# Patient Record
Sex: Female | Born: 1935 | Race: White | Hispanic: No | Marital: Single
Health system: Southern US, Community
[De-identification: ages and names within clinical notes are randomized; demographics above are authoritative.]

## PROBLEM LIST (undated history)

## (undated) DIAGNOSIS — Z9109 Other allergy status, other than to drugs and biological substances: Secondary | ICD-10-CM

## (undated) DIAGNOSIS — E785 Hyperlipidemia, unspecified: Secondary | ICD-10-CM

## (undated) DIAGNOSIS — G479 Sleep disorder, unspecified: Secondary | ICD-10-CM

## (undated) DIAGNOSIS — G44009 Cluster headache syndrome, unspecified, not intractable: Secondary | ICD-10-CM

## (undated) DIAGNOSIS — C801 Malignant (primary) neoplasm, unspecified: Secondary | ICD-10-CM

## (undated) DIAGNOSIS — K635 Polyp of colon: Secondary | ICD-10-CM

## (undated) DIAGNOSIS — M1990 Unspecified osteoarthritis, unspecified site: Secondary | ICD-10-CM

## (undated) DIAGNOSIS — M8580 Other specified disorders of bone density and structure, unspecified site: Secondary | ICD-10-CM

## (undated) HISTORY — PX: BILATERAL ANTERIOR TOTAL HIP ARTHROPLASTY: SHX5567

## (undated) HISTORY — DX: Other specified disorders of bone density and structure, unspecified site: M85.80

## (undated) HISTORY — DX: Hyperlipidemia, unspecified: E78.5

## (undated) HISTORY — PX: TUBAL LIGATION: SHX77

## (undated) HISTORY — DX: Polyp of colon: K63.5

## (undated) HISTORY — PX: REPLACEMENT TOTAL KNEE: SUR1224

## (undated) HISTORY — DX: Malignant (primary) neoplasm, unspecified: C80.1

## (undated) HISTORY — DX: Cluster headache syndrome, unspecified, not intractable: G44.009

---

## 1998-06-26 ENCOUNTER — Other Ambulatory Visit: Admission: RE | Admit: 1998-06-26 | Discharge: 1998-06-26 | Payer: Self-pay | Admitting: Obstetrics and Gynecology

## 1999-08-13 ENCOUNTER — Other Ambulatory Visit: Admission: RE | Admit: 1999-08-13 | Discharge: 1999-08-13 | Payer: Self-pay | Admitting: Obstetrics and Gynecology

## 2000-08-13 ENCOUNTER — Other Ambulatory Visit: Admission: RE | Admit: 2000-08-13 | Discharge: 2000-08-13 | Payer: Self-pay | Admitting: Obstetrics and Gynecology

## 2001-10-13 DIAGNOSIS — C439 Malignant melanoma of skin, unspecified: Secondary | ICD-10-CM

## 2001-10-13 DIAGNOSIS — C801 Malignant (primary) neoplasm, unspecified: Secondary | ICD-10-CM

## 2001-10-13 HISTORY — DX: Malignant melanoma of skin, unspecified: C43.9

## 2001-10-13 HISTORY — PX: MELANOMA EXCISION: SHX5266

## 2001-10-13 HISTORY — DX: Malignant (primary) neoplasm, unspecified: C80.1

## 2002-01-19 ENCOUNTER — Other Ambulatory Visit: Admission: RE | Admit: 2002-01-19 | Discharge: 2002-01-19 | Payer: Self-pay | Admitting: Obstetrics and Gynecology

## 2004-02-14 ENCOUNTER — Other Ambulatory Visit: Admission: RE | Admit: 2004-02-14 | Discharge: 2004-02-14 | Payer: Self-pay | Admitting: Obstetrics and Gynecology

## 2005-04-17 ENCOUNTER — Other Ambulatory Visit: Admission: RE | Admit: 2005-04-17 | Discharge: 2005-04-17 | Payer: Self-pay | Admitting: Addiction Medicine

## 2006-04-24 ENCOUNTER — Other Ambulatory Visit: Admission: RE | Admit: 2006-04-24 | Discharge: 2006-04-24 | Payer: Self-pay | Admitting: Obstetrics and Gynecology

## 2007-04-27 ENCOUNTER — Other Ambulatory Visit: Admission: RE | Admit: 2007-04-27 | Discharge: 2007-04-27 | Payer: Self-pay | Admitting: Obstetrics and Gynecology

## 2008-05-04 ENCOUNTER — Other Ambulatory Visit: Admission: RE | Admit: 2008-05-04 | Discharge: 2008-05-04 | Payer: Self-pay | Admitting: Obstetrics and Gynecology

## 2009-05-11 ENCOUNTER — Encounter: Payer: Self-pay | Admitting: Obstetrics and Gynecology

## 2009-05-11 ENCOUNTER — Ambulatory Visit: Payer: Self-pay | Admitting: Obstetrics and Gynecology

## 2009-05-11 ENCOUNTER — Other Ambulatory Visit: Admission: RE | Admit: 2009-05-11 | Discharge: 2009-05-11 | Payer: Self-pay | Admitting: Obstetrics and Gynecology

## 2009-06-07 ENCOUNTER — Ambulatory Visit: Payer: Self-pay | Admitting: Internal Medicine

## 2009-06-19 ENCOUNTER — Ambulatory Visit: Payer: Self-pay | Admitting: Internal Medicine

## 2009-10-13 HISTORY — PX: KNEE SURGERY: SHX244

## 2010-02-21 ENCOUNTER — Ambulatory Visit: Payer: Self-pay | Admitting: Family Medicine

## 2010-02-21 DIAGNOSIS — Z860100 Personal history of colon polyps, unspecified: Secondary | ICD-10-CM | POA: Insufficient documentation

## 2010-02-21 DIAGNOSIS — G44009 Cluster headache syndrome, unspecified, not intractable: Secondary | ICD-10-CM

## 2010-02-21 DIAGNOSIS — J019 Acute sinusitis, unspecified: Secondary | ICD-10-CM

## 2010-02-21 DIAGNOSIS — M1710 Unilateral primary osteoarthritis, unspecified knee: Secondary | ICD-10-CM

## 2010-02-21 LAB — CONVERTED CEMR LAB
Blood in Urine, dipstick: NEGATIVE
Hemoglobin: 14.2 g/dL
Ketones, urine, test strip: NEGATIVE
Nitrite: NEGATIVE
Protein, U semiquant: NEGATIVE
Specific Gravity, Urine: 1.02
Urobilinogen, UA: 0.2
WBC Urine, dipstick: NEGATIVE

## 2010-02-25 ENCOUNTER — Encounter: Payer: Self-pay | Admitting: Family Medicine

## 2010-04-16 ENCOUNTER — Inpatient Hospital Stay (HOSPITAL_COMMUNITY): Admission: RE | Admit: 2010-04-16 | Discharge: 2010-04-18 | Payer: Self-pay | Admitting: Orthopedic Surgery

## 2010-05-13 ENCOUNTER — Ambulatory Visit: Payer: Self-pay | Admitting: Obstetrics and Gynecology

## 2010-11-12 NOTE — Assessment & Plan Note (Signed)
Summary: new pt/pre surgical exam/ok per doc/njr   Vital Signs:  Patient profile:   75 year old female Weight:      98 pounds O2 Sat:      94 % Temp:     98.2 degrees F Pulse rate:   70 / minute BP sitting:   140 / 90  (left arm)  Vitals Entered By: Pura Spice, RN (Feb 21, 2010 10:48 AM) CC: to establish having rt knee replacement soon by dr Charlann Boxer needs clearance.  Is Patient Diabetic? No   History of Present Illness: This 76 year old white-haired female is in to establish as a new patient and also to have a preoperative examination for a right knee replacement by Dr. Susy Frizzle:  Charlann Boxer She has had arthritis of the knees and it has progressed to the point that her right knee is severe enough that the lower leg is in a valgus position to 20 She has fallen twice in the last month and have been severe enough pain and inability to function that she is ready for a right knee replacement Blood pressure rechecked 130/78 Hasn't centimeter related with 2 mg one at h.s. Patient is on Actonel and calcium plus vitamin D Dr. Herma Mering history gynecologist and does her Pap smear and ordered her mammogram and bone density studies  EKG  Procedure date:  02/21/2010  Findings:       sinus rhythm with rate of:  60 sinus bradycardia  normal ECG except for rate   Allergies (verified): No Known Drug Allergies  Past History:  Past Medical History: history of migraine headache Osteopenia Chronic insomnia Arthritis  Past Surgical History: colonoscopic exam with polyps benign  Review of Systems      See HPI General:  See HPI; Denies chills, fatigue, fever, loss of appetite, malaise, sleep disorder, sweats, weakness, and weight loss. Eyes:  Denies blurring, discharge, double vision, eye irritation, eye pain, halos, itching, light sensitivity, red eye, vision loss-1 eye, and vision loss-both eyes. ENT:  Denies decreased hearing, difficulty swallowing, ear discharge, earache, hoarseness, nasal  congestion, nosebleeds, postnasal drainage, ringing in ears, sinus pressure, and sore throat. CV:  Denies bluish discoloration of lips or nails, chest pain or discomfort, difficulty breathing at night, difficulty breathing while lying down, fainting, fatigue, leg cramps with exertion, lightheadness, near fainting, palpitations, shortness of breath with exertion, swelling of feet, swelling of hands, and weight gain. Resp:  Denies chest discomfort, chest pain with inspiration, cough, coughing up blood, excessive snoring, hypersomnolence, morning headaches, pleuritic, shortness of breath, sputum productive, and wheezing. GI:  Denies abdominal pain, bloody stools, change in bowel habits, constipation, dark tarry stools, diarrhea, excessive appetite, gas, hemorrhoids, indigestion, loss of appetite, nausea, vomiting, vomiting blood, and yellowish skin color. GU:  Denies abnormal vaginal bleeding, decreased libido, discharge, dysuria, genital sores, hematuria, incontinence, nocturia, urinary frequency, and urinary hesitancy. MS:  See HPI; Complains of joint pain. Derm:  Denies changes in color of skin, changes in nail beds, dryness, excessive perspiration, flushing, hair loss, insect bite(s), itching, lesion(s), poor wound healing, and rash. Neuro:  Complains of headaches; migraines, takes Depakote ER for prevention. Psych:  Denies alternate hallucination ( auditory/visual), anxiety, depression, easily angered, easily tearful, irritability, mental problems, panic attacks, sense of great danger, suicidal thoughts/plans, thoughts of violence, unusual visions or sounds, and thoughts /plans of harming others.  Physical Exam  General:  Well-developed,well-nourished,in no acute distress; alert,appropriate and cooperative throughout examination Head:  Normocephalic and atraumatic without obvious abnormalities. No apparent alopecia  or balding. Eyes:  No corneal or conjunctival inflammation noted. EOMI. Perrla.  Funduscopic exam benign, without hemorrhages, exudates or papilledema. Vision grossly normal. Ears:  External ear exam shows no significant lesions or deformities.  Otoscopic examination reveals clear canals, tympanic membranes are intact bilaterally without bulging, retraction, inflammation or discharge. Hearing is grossly normal bilaterally. Nose:  External nasal examination shows no deformity or inflammation. Nasal mucosa are pink and moist without lesions or exudates. Mouth:  Oral mucosa and oropharynx without lesions or exudates.  Teeth in good repair. Neck:  No deformities, masses, or tenderness noted. Chest Wall:  No deformities, masses, or tenderness noted. Breasts:  No mass, nodules, thickening, tenderness, bulging, retraction, inflamation, nipple discharge or skin changes noted.   Lungs:  Normal respiratory effort, chest expands symmetrically. Lungs are clear to auscultation, no crackles or wheezes. Heart:  Normal rate and regular rhythm. S1 and S2 normal without gallop, murmur, click, rub or other extra sounds. Abdomen:  Bowel sounds positive,abdomen soft and non-tender without masses, organomegaly or hernias noted. Rectal:  not examined Genitalia:  not examined Msk:  right knee is tender painful movement, lower leg from knee is in a valgus position 20 outward Pulses:  R and L carotid,radial,femoral,dorsalis pedis and posterior tibial pulses are full and equal bilaterally Extremities:  No clubbing, cyanosis, edema, or deformity noted with normal full range of motion of all joints.   Neurologic:  No cranial nerve deficits noted. Station and gait are normal. Plantar reflexes are down-going bilaterally. DTRs are symmetrical throughout. Sensory, motor and coordinative functions appear intact.   Impression & Recommendations:  Problem # 1:  OSTEOARTHRITIS, KNEES, BILATERAL (ICD-715.96) Assessment New  Problem # 2:  COLONIC POLYPS, HX OF (ICD-V12.72) Assessment: Unchanged  Problem # 3:   HEADACHE (ICD-784.0) Assessment: Unchanged  Complete Medication List: 1)  Actonel  2)  Calcium Carbonate 600 Mg Tabs (Calcium carbonate) .... Two times a day 3)  Depakote Er  4)  Lunesta 3 Mg Tabs (Eszopiclone) .Marland Kitchen.. 1 hs  Other Orders: EKG w/ Interpretation (93000) Hgb (46270) UA Dipstick w/o Micro (automated)  (81003) Fingerstick (35009)  Patient Instructions: 1)  physical examination revealed a healthy female that is in good physical condition to have a knee replacement of the right knee. Hemoglobin 14.2 2)  Urinalysis negative 3)  Since form to Dr. Charlann Boxer Prescriptions: LUNESTA 3 MG TABS (ESZOPICLONE) 1 hs  #30 x 0   Entered and Authorized by:   Judithann Sheen MD   Signed by:   Judithann Sheen MD on 02/21/2010   Method used:   Print then Give to Patient   RxID:   [id number]   Laboratory Results   Urine Tests  Date/Time Recieved: Feb 21, 2010 12:06 PM  Date/Time Reported: Feb 21, 2010 12:06 PM   Routine Urinalysis   Color: yellow Appearance: Clear Glucose: negative   (Normal Range: Negative) Bilirubin: negative   (Normal Range: Negative) Ketone: negative   (Normal Range: Negative) Spec. Gravity: 1.020   (Normal Range: 1.003-1.035) Blood: negative   (Normal Range: Negative) pH: 5.0   (Normal Range: 5.0-8.0) Protein: negative   (Normal Range: Negative) Urobilinogen: 0.2   (Normal Range: 0-1) Nitrite: negative   (Normal Range: Negative) Leukocyte Esterace: negative   (Normal Range: Negative)    Comments: Wynona Canes, CMA  Feb 21, 2010 12:07 PM   Blood Tests   Date/Time Recieved: Feb 21, 2010 12:05 PM  Date/Time Reported: Feb 21, 2010 12:05 PM  CBC HGB:  14.2 g/dL   (Normal Range: 28.4-13.2 in Males, 12.0-15.0 in Females) Comments: Wynona Canes, CMA  Feb 21, 2010 12:06 PM      Laboratory Results   Urine Tests    Routine Urinalysis   Color: yellow Appearance: Clear Glucose: negative   (Normal Range:  Negative) Bilirubin: negative   (Normal Range: Negative) Ketone: negative   (Normal Range: Negative) Spec. Gravity: 1.020   (Normal Range: 1.003-1.035) Blood: negative   (Normal Range: Negative) pH: 5.0   (Normal Range: 5.0-8.0) Protein: negative   (Normal Range: Negative) Urobilinogen: 0.2   (Normal Range: 0-1) Nitrite: negative   (Normal Range: Negative) Leukocyte Esterace: negative   (Normal Range: Negative)    Comments: Wynona Canes, CMA  Feb 21, 2010 12:07 PM   CBC   HGB:  14.2 g/dL   (Normal Range: 44.0-10.2 in Males, 12.0-15.0 in Females) Comments: Wynona Canes, CMA  Feb 21, 2010 12:06 PM       Appended Document: new pt/pre surgical exam/ok per doc/njr electrocardiogram normal sinus bradycardia

## 2010-11-15 NOTE — Letter (Signed)
Summary: [facility]  [facility]   Imported By: Maryln Gottron 03/14/2010 14:58:50  _____________________________________________________________________  External Attachment:    Type:   Image     Comment:   External Document

## 2010-12-29 LAB — ABO/RH: ABO/RH(D): O POS

## 2010-12-29 LAB — CBC
HCT: 29.8 % — ABNORMAL LOW (ref 36.0–46.0)
HCT: 40.4 % (ref 36.0–46.0)
Hemoglobin: 9.9 g/dL — ABNORMAL LOW (ref 12.0–15.0)
MCHC: 34.9 g/dL (ref 30.0–36.0)
MCV: 92.9 fL (ref 78.0–100.0)
MCV: 94.3 fL (ref 78.0–100.0)
RBC: 3.2 MIL/uL — ABNORMAL LOW (ref 3.87–5.11)
RBC: 4.29 MIL/uL (ref 3.87–5.11)
RDW: 13.4 % (ref 11.5–15.5)
RDW: 13.7 % (ref 11.5–15.5)
WBC: 6.2 10*3/uL (ref 4.0–10.5)
WBC: 7.1 10*3/uL (ref 4.0–10.5)
WBC: 4.4 10*3/uL (ref 4.0–10.5)

## 2010-12-29 LAB — URINALYSIS, ROUTINE W REFLEX MICROSCOPIC
Bilirubin Urine: NEGATIVE
Glucose, UA: NEGATIVE mg/dL
Hgb urine dipstick: NEGATIVE
Specific Gravity, Urine: 1.024 (ref 1.005–1.030)
pH: 6 (ref 5.0–8.0)

## 2010-12-29 LAB — PROTIME-INR: INR: 0.98 (ref 0.00–1.49)

## 2010-12-29 LAB — SURGICAL PCR SCREEN
MRSA, PCR: NEGATIVE
Staphylococcus aureus: POSITIVE — AB

## 2010-12-29 LAB — BASIC METABOLIC PANEL
BUN: 14 mg/dL (ref 6–23)
BUN: 18 mg/dL (ref 6–23)
Calcium: 8.2 mg/dL — ABNORMAL LOW (ref 8.4–10.5)
Chloride: 106 mEq/L (ref 96–112)
Chloride: 102 mEq/L (ref 96–112)
GFR calc Af Amer: >60 mL/min (ref >60)
GFR calc non Af Amer: >60 mL/min (ref >60)
Glucose, Bld: 87 mg/dL (ref 70–99)
Potassium: 3.9 mEq/L (ref 3.5–5.1)
Potassium: 4 mEq/L (ref 3.5–5.1)
Potassium: 4.6 mEq/L (ref 3.5–5.1)
Sodium: 140 mEq/L (ref 135–145)
Sodium: 141 mEq/L (ref 135–145)

## 2010-12-29 LAB — DIFFERENTIAL
Eosinophils Absolute: 0.2 10*3/uL (ref 0.0–0.7)
Eosinophils Relative: 5 % (ref 0–5)
Lymphs Abs: 0.9 10*3/uL (ref 0.7–4.0)
Monocytes Relative: 8 % (ref 3–12)
Neutrophils Relative %: 66 % (ref 43–77)

## 2010-12-29 LAB — URINE MICROSCOPIC-ADD ON

## 2010-12-29 LAB — TYPE AND SCREEN: Antibody Screen: NEGATIVE

## 2011-05-13 ENCOUNTER — Encounter: Payer: Self-pay | Admitting: Obstetrics and Gynecology

## 2011-05-20 ENCOUNTER — Encounter: Payer: Self-pay | Admitting: Gynecology

## 2011-05-20 ENCOUNTER — Encounter: Payer: Self-pay | Admitting: Obstetrics and Gynecology

## 2011-06-04 ENCOUNTER — Other Ambulatory Visit (HOSPITAL_COMMUNITY)
Admission: RE | Admit: 2011-06-04 | Discharge: 2011-06-04 | Disposition: A | Discharge status: Home / Self Care | Payer: Medicare Other | Source: Ambulatory Visit | Attending: Obstetrics and Gynecology | Admitting: Obstetrics and Gynecology

## 2011-06-04 ENCOUNTER — Encounter: Payer: Self-pay | Admitting: Obstetrics and Gynecology

## 2011-06-04 ENCOUNTER — Ambulatory Visit (INDEPENDENT_AMBULATORY_CARE_PROVIDER_SITE_OTHER): Payer: Medicare Other | Admitting: Obstetrics and Gynecology

## 2011-06-04 VITALS — BP 112/60 | Ht 59.0 in | Wt 98.0 lb

## 2011-06-04 DIAGNOSIS — Z124 Encounter for screening for malignant neoplasm of cervix: Secondary | ICD-10-CM | POA: Insufficient documentation

## 2011-06-04 DIAGNOSIS — M8580 Other specified disorders of bone density and structure, unspecified site: Secondary | ICD-10-CM | POA: Insufficient documentation

## 2011-06-04 DIAGNOSIS — K649 Unspecified hemorrhoids: Secondary | ICD-10-CM | POA: Insufficient documentation

## 2011-06-04 NOTE — Progress Notes (Signed)
The patient came back to see me today for further follow up. The first thing we discussed with her was her Actonel. She is now been on it for 6 years. Her last bone density showed improvement of 3 of the 4 areas of interest. She takes calcium, D, and Exercises regularly. She has had no fractures. She does have some atrophic vaginitis but says she has no discomfort with intercourse. She is doing well with her hemorrhoids now without any bleeding. She does have nocturia but not frequently. She is having no pelvic pain and no vaginal bleeding.  Past medical history, family history, and social history all reviewed. They are in epic record.  Review of systems reviewed( 9 point) and other than the above there are no pertinent positives or negative.  Physical examination: HEENT within normal limits. Neck: Thyroid not large. No masses. Supraclavicular nodes: not enlarged. Breasts: Examined in both sitting midline position. No skin changes and no masses. Abdomen: Soft no guarding rebound or masses or hernia. Pelvic: External: Within normal limits. BUS: Within normal limits. Vaginal:within normal limits. Poor estrogen effect. No evidence of cystocele rectocele or enterocele. Cervix: clean. Uterus: Normal size and shape. Adnexa: No masses. Rectovaginal exam: Confirmatory and negative. Extremities: Within normal limits.  Assessment 1. Low bone mass 2. Atrophic vaginitis 3. Hemorrhoids 4. Nocturia  Plan: We have placed her on drug holiday from her Actonel. When she gets her mammogram next her she will get a followup bone density. We will not treat with medication any of  the above problems.

## 2011-06-04 NOTE — Patient Instructions (Signed)
Check with Dr. Scotty Court UJ:[id number] test. Mammogram and bone density next summer.

## 2011-06-26 ENCOUNTER — Encounter: Payer: PRIVATE HEALTH INSURANCE | Admitting: Family Medicine

## 2011-07-17 ENCOUNTER — Encounter: Payer: PRIVATE HEALTH INSURANCE | Admitting: Family Medicine

## 2011-11-10 ENCOUNTER — Other Ambulatory Visit: Payer: Self-pay | Admitting: Family Medicine

## 2011-11-14 NOTE — Telephone Encounter (Signed)
Rx last sent 02/16/10. Pls advise.

## 2011-11-16 NOTE — Telephone Encounter (Signed)
DENIED.  This patient has not been seen here in almost 2 years!

## 2012-05-14 ENCOUNTER — Other Ambulatory Visit: Payer: Self-pay | Admitting: *Deleted

## 2012-05-14 DIAGNOSIS — M8580 Other specified disorders of bone density and structure, unspecified site: Secondary | ICD-10-CM

## 2012-06-04 ENCOUNTER — Encounter: Payer: Self-pay | Admitting: Obstetrics and Gynecology

## 2012-06-04 ENCOUNTER — Ambulatory Visit (INDEPENDENT_AMBULATORY_CARE_PROVIDER_SITE_OTHER): Payer: Medicare Other | Admitting: Obstetrics and Gynecology

## 2012-06-04 VITALS — BP 124/70 | Ht 59.0 in | Wt 96.0 lb

## 2012-06-04 DIAGNOSIS — M949 Disorder of cartilage, unspecified: Secondary | ICD-10-CM

## 2012-06-04 DIAGNOSIS — N952 Postmenopausal atrophic vaginitis: Secondary | ICD-10-CM

## 2012-06-04 DIAGNOSIS — M8580 Other specified disorders of bone density and structure, unspecified site: Secondary | ICD-10-CM

## 2012-06-04 DIAGNOSIS — M899 Disorder of bone, unspecified: Secondary | ICD-10-CM

## 2012-06-04 DIAGNOSIS — R351 Nocturia: Secondary | ICD-10-CM

## 2012-06-04 DIAGNOSIS — K649 Unspecified hemorrhoids: Secondary | ICD-10-CM

## 2012-06-04 NOTE — Progress Notes (Signed)
Patient came to see me today for further followup. We have watched her for a long time with osteopenia. She took Actonel  for 6 years. She used to do her bone densities at [facility] radiology. Due to stability we put her on drug holiday one year ago. She has had no fractures. Her last bone density was 2011. She is scheduled for yearly mammogram for next week. She has always had normal Pap smears. Her last Pap smear was 2012. She is having no vaginal bleeding. She is having no pelvic pain. She has atrophic vaginitis but is comfortable with intercourse. She does have hemorrhoids but they have been asymptomatic recently. She is looking for a new PCP as her PCP retired. She does have nocturia x3 but is not a good sleeper and does not want take medication for this. She does not have urgency or urinary incontinence. She does not have dysuria.  ROS: 12 system review done. Pertinent positives above.  Physical examination:Veronica Wells present. HEENT within normal limits. Neck: Thyroid not large. No masses. Supraclavicular nodes: not enlarged. Breasts: Examined in both sitting and lying  position. No skin changes and no masses. Abdomen: Soft no guarding rebound or masses or hernia. Pelvic: External: Within normal limits. BUS: Within normal limits. Vaginal:within normal limits. Poor estrogen effect. No evidence of cystocele rectocele or enterocele. Cervix: clean. Uterus: Normal size and shape. Adnexa: No masses. Rectovaginal exam: Confirmatory and negative. Extremities: Within normal limits.  Assessment: #1. Osteopenia #2. Atrophic vaginitis # 3. Nocturia #4. Hemorrhoids  Plan: Mammogram and  bone density next week. The new Pap smear guidelines were discussed with the patient. No pap done.

## 2012-06-04 NOTE — Patient Instructions (Signed)
Schedule  bone density at time of mammogram.

## 2012-06-18 ENCOUNTER — Encounter: Payer: Self-pay | Admitting: Obstetrics and Gynecology

## 2012-06-22 ENCOUNTER — Other Ambulatory Visit: Payer: Self-pay | Admitting: Obstetrics and Gynecology

## 2012-06-22 DIAGNOSIS — M8580 Other specified disorders of bone density and structure, unspecified site: Secondary | ICD-10-CM

## 2012-07-05 ENCOUNTER — Telehealth: Payer: Self-pay | Admitting: *Deleted

## 2012-07-05 NOTE — Telephone Encounter (Signed)
Okay. Give her 68 with one refill.

## 2012-07-05 NOTE — Telephone Encounter (Signed)
(  pt aware you out of the office) Pt calling requesting rx for lunesta 2 mg, pt said you have given this to before. Please advise

## 2012-07-06 MED ORDER — ESZOPICLONE 2 MG PO TABS: 2.0000 mg | ORAL_TABLET | Freq: Every day | ORAL | Status: DC

## 2012-07-06 NOTE — Telephone Encounter (Signed)
rx sent, pt informed as well.  

## 2012-07-06 NOTE — Addendum Note (Signed)
Addended by: Aura Camps on: 07/06/2012 09:01 AM   Modules accepted: Orders

## 2012-07-20 NOTE — Telephone Encounter (Signed)
Rx called into pharmacy, pt said pharmacy never received Rx.

## 2012-09-24 ENCOUNTER — Other Ambulatory Visit: Payer: Self-pay | Admitting: Obstetrics and Gynecology

## 2012-09-24 NOTE — Telephone Encounter (Signed)
Called in [facility] 

## 2013-04-13 ENCOUNTER — Other Ambulatory Visit: Payer: Self-pay | Admitting: Dermatology

## 2013-04-16 ENCOUNTER — Other Ambulatory Visit: Payer: Self-pay | Admitting: Obstetrics and Gynecology

## 2013-04-19 NOTE — Telephone Encounter (Signed)
rx called in [facility] 

## 2013-06-07 ENCOUNTER — Encounter: Payer: Self-pay | Admitting: Gynecology

## 2013-06-07 ENCOUNTER — Ambulatory Visit (INDEPENDENT_AMBULATORY_CARE_PROVIDER_SITE_OTHER): Payer: Medicare Other | Admitting: Gynecology

## 2013-06-07 VITALS — BP 126/78 | Ht <= 58 in | Wt 99.0 lb

## 2013-06-07 DIAGNOSIS — M8580 Other specified disorders of bone density and structure, unspecified site: Secondary | ICD-10-CM

## 2013-06-07 DIAGNOSIS — N952 Postmenopausal atrophic vaginitis: Secondary | ICD-10-CM

## 2013-06-07 DIAGNOSIS — R229 Localized swelling, mass and lump, unspecified: Secondary | ICD-10-CM

## 2013-06-07 DIAGNOSIS — R2232 Localized swelling, mass and lump, left upper limb: Secondary | ICD-10-CM

## 2013-06-07 DIAGNOSIS — K649 Unspecified hemorrhoids: Secondary | ICD-10-CM

## 2013-06-07 DIAGNOSIS — Z23 Encounter for immunization: Secondary | ICD-10-CM

## 2013-06-07 DIAGNOSIS — R2230 Localized swelling, mass and lump, unspecified upper limb: Secondary | ICD-10-CM | POA: Insufficient documentation

## 2013-06-07 DIAGNOSIS — M899 Disorder of bone, unspecified: Secondary | ICD-10-CM

## 2013-06-07 NOTE — Patient Instructions (Addendum)
Tetanus, Diphtheria, Pertussis (Tdap) Vaccine What You Need to Know WHY GET VACCINATED? Tetanus, diphtheria and pertussis can be very serious diseases, even for adolescents and adults. Tdap vaccine can protect us from these diseases. TETANUS (Lockjaw) causes painful muscle tightening and stiffness, usually all over the body.  It can lead to tightening of muscles in the head and neck so you can't open your mouth, swallow, or sometimes even breathe. Tetanus kills about 1 out of 5 people who are infected. DIPHTHERIA can cause a thick coating to form in the back of the throat.  It can lead to breathing problems, paralysis, heart failure, and death. PERTUSSIS (Whooping Cough) causes severe coughing spells, which can cause difficulty breathing, vomiting and disturbed sleep.  It can also lead to weight loss, incontinence, and rib fractures. Up to 2 in 100 adolescents and 5 in 100 adults with pertussis are hospitalized or have complications, which could include pneumonia and death. These diseases are caused by bacteria. Diphtheria and pertussis are spread from person to person through coughing or sneezing. Tetanus enters the body through cuts, scratches, or wounds. Before vaccines, the [place] saw as many as 200,000 cases a year of diphtheria and pertussis, and hundreds of cases of tetanus. Since vaccination began, tetanus and diphtheria have dropped by about 99% and pertussis by about 80%. TDAP VACCINE Tdap vaccine can protect adolescents and adults from tetanus, diphtheria, and pertussis. One dose of Tdap is routinely given at age 11 or 12. People who did not get Tdap at that age should get it as soon as possible. Tdap is especially important for health care professionals and anyone having close contact with a baby younger than 12 months. Pregnant women should get a dose of Tdap during every pregnancy, to protect the newborn from pertussis. Infants are most at risk for severe, life-threatening  complications from pertussis. A similar vaccine, called Td, protects from tetanus and diphtheria, but not pertussis. A Td booster should be given every 10 years. Tdap may be given as one of these boosters if you have not already gotten a dose. Tdap may also be given after a severe cut or burn to prevent tetanus infection. Your doctor can give you more information. Tdap may safely be given at the same time as other vaccines. SOME PEOPLE SHOULD NOT GET THIS VACCINE  If you ever had a life-threatening allergic reaction after a dose of any tetanus, diphtheria, or pertussis containing vaccine, OR if you have a severe allergy to any part of this vaccine, you should not get Tdap. Tell your doctor if you have any severe allergies.  If you had a coma, or long or multiple seizures within 7 days after a childhood dose of DTP or DTaP, you should not get Tdap, unless a cause other than the vaccine was found. You can still get Td.  Talk to your doctor if you:  have epilepsy or another nervous system problem,  had severe pain or swelling after any vaccine containing diphtheria, tetanus or pertussis,  ever had Guillain-Barr Syndrome (GBS),  aren't feeling well on the day the shot is scheduled. RISKS OF A VACCINE REACTION With any medicine, including vaccines, there is a chance of side effects. These are usually mild and go away on their own, but serious reactions are also possible. Brief fainting spells can follow a vaccination, leading to injuries from falling. Sitting or lying down for about 15 minutes can help prevent these. Tell your doctor if you feel dizzy or light-headed, or   have vision changes or ringing in the ears. Mild problems following Tdap (Did not interfere with activities)  Pain where the shot was given (about 3 in 4 adolescents or 2 in 3 adults)  Redness or swelling where the shot was given (about 1 person in 5)  Mild fever of at least 100.4F (up to about 1 in 25 adolescents or 1 in  100 adults)  Headache (about 3 or 4 people in 10)  Tiredness (about 1 person in 3 or 4)  Nausea, vomiting, diarrhea, stomach ache (up to 1 in 4 adolescents or 1 in 10 adults)  Chills, body aches, sore joints, rash, swollen glands (uncommon) Moderate problems following Tdap (Interfered with activities, but did not require medical attention)  Pain where the shot was given (about 1 in 5 adolescents or 1 in 100 adults)  Redness or swelling where the shot was given (up to about 1 in 16 adolescents or 1 in 25 adults)  Fever over 102F (about 1 in 100 adolescents or 1 in 250 adults)  Headache (about 3 in 20 adolescents or 1 in 10 adults)  Nausea, vomiting, diarrhea, stomach ache (up to 1 or 3 people in 100)  Swelling of the entire arm where the shot was given (up to about 3 in 100). Severe problems following Tdap (Unable to perform usual activities, required medical attention)  Swelling, severe pain, bleeding and redness in the arm where the shot was given (rare). A severe allergic reaction could occur after any vaccine (estimated less than 1 in a million doses). WHAT IF THERE IS A SERIOUS REACTION? What should I look for?  Look for anything that concerns you, such as signs of a severe allergic reaction, very high fever, or behavior changes. Signs of a severe allergic reaction can include hives, swelling of the face and throat, difficulty breathing, a fast heartbeat, dizziness, and weakness. These would start a few minutes to a few hours after the vaccination. What should I do?  If you think it is a severe allergic reaction or other emergency that can't wait, call 9-1-1 or get the person to the nearest hospital. Otherwise, call your doctor.  Afterward, the reaction should be reported to the "Vaccine Adverse Event Reporting System" (VAERS). Your doctor might file this report, or you can do it yourself through the VAERS web site at www.vaers.hhs.gov, or by calling 1-800-822-7967. VAERS is  only for reporting reactions. They do not give medical advice.  THE NATIONAL VACCINE INJURY COMPENSATION PROGRAM The National Vaccine Injury Compensation Program (VICP) is a federal program that was created to compensate people who may have been injured by certain vaccines. Persons who believe they may have been injured by a vaccine can learn about the program and about filing a claim by calling 1-800-338-2382 or visiting the VICP website at www.hrsa.gov/vaccinecompensation. HOW CAN I LEARN MORE?  Ask your doctor.  Call your local or state health department.  Contact the Centers for Disease Control and Prevention (CDC):  Call 1-800-232-4636 or visit CDC's website at www.cdc.gov/vaccines. CDC Tdap Vaccine VIS (02/19/12) Document Released: 03/30/2012 Document Revised: 06/23/2012 Document Reviewed: 03/30/2012 ExitCare Patient Information 2014 [employer].  Shingles Vaccine What You Need to Know WHAT IS SHINGLES?  Shingles is a painful skin rash, often with blisters. It is also called Herpes Zoster or just Zoster.  A shingles rash usually appears on one side of the face or body and lasts from 2 to 4 weeks. Its main symptom is pain, which can be quite severe.   Other symptoms of shingles can include fever, headache, chills, and upset stomach. Very rarely, a shingles infection can lead to pneumonia, hearing problems, blindness, brain inflammation (encephalitis), or death.  For about 1 person in 5, severe pain can continue even after the rash clears up. This is called post-herpetic neuralgia.  Shingles is caused by the Varicella Zoster virus. This is the same virus that causes chickenpox. Only someone who has had a case of chickenpox or rarely, has gotten chickenpox vaccine, can get shingles. The virus stays in your body. It can reappear many years later to cause a case of shingles.  You cannot catch shingles from another person with shingles. However, a person who has never had chickenpox (or  chickenpox vaccine) could get chickenpox from someone with shingles. This is not very common.  Shingles is far more common in people 50 and older than in younger people. It is also more common in people whose immune systems are weakened because of a disease such as cancer or drugs such as steroids or chemotherapy.  At least 1 million people get shingles per year in the [place]. SHINGLES VACCINE  A vaccine for shingles was licensed in 2006. In clinical trials, the vaccine reduced the risk of shingles by 50%. It can also reduce the pain in people who still get shingles after being vaccinated.  A single dose of shingles vaccine is recommended for adults 60 years of age and older. SOME PEOPLE SHOULD NOT GET SHINGLES VACCINE OR SHOULD WAIT A person should not get shingles vaccine if he or she:  Has ever had a life-threatening allergic reaction to gelatin, the antibiotic neomycin, or any other component of shingles vaccine. Tell your caregiver if you have any severe allergies.  Has a weakened immune system because of current:  AIDS or another disease that affects the immune system.  Treatment with drugs that affect the immune system, such as prolonged use of high-dose steroids.  Cancer treatment, such as radiation or chemotherapy.  Cancer affecting the bone marrow or lymphatic system, such as leukemia or lymphoma.  Is pregnant, or might be pregnant. Women should not become pregnant until at least 4 weeks after getting shingles vaccine. Someone with a minor illness, such as a cold, may be vaccinated. Anyone with a moderate or severe acute illness should usually wait until he or she recovers before getting the vaccine. This includes anyone with a temperature of 101.3 F (38 C) or higher. WHAT ARE THE RISKS FROM SHINGLES VACCINE?  A vaccine, like any medicine, could possibly cause serious problems, such as severe allergic reactions. However, the risk of a vaccine causing serious harm, or  death, is extremely small.  No serious problems have been identified with shingles vaccine. Mild Problems  Redness, soreness, swelling, or itching at the site of the injection (about 1 person in 3).  Headache (about 1 person in 70). Like all vaccines, shingles vaccine is being closely monitored for unusual or severe problems. WHAT IF THERE IS A MODERATE OR SEVERE REACTION? What should I look for? Any unusual condition, such as a severe allergic reaction or a high fever. If a severe allergic reaction occurred, it would be within a few minutes to an hour after the shot. Signs of a serious allergic reaction can include difficulty breathing, weakness, hoarseness or wheezing, a fast heartbeat, hives, dizziness, paleness, or swelling of the throat. What should I do?  Call your caregiver, or get the person to a caregiver right away.  Tell the   caregiver what happened, the date and time it happened, and when the vaccination was given.  Ask the caregiver to report the reaction by filing a Vaccine Adverse Event Reporting System (VAERS) form. Or, you can file this report through the VAERS web site at www.vaers.hhs.gov or by calling 1-800-822-7967. VAERS does not provide medical advice. HOW CAN I LEARN MORE?  Ask your caregiver. He or she can give you the vaccine package insert or suggest other sources of information.  Contact the Centers for Disease Control and Prevention (CDC):  Call 1-800-232-4636 (1-800-CDC-INFO).  Visit the CDC website at www.cdc.gov/vaccines CDC Shingles Vaccine VIS (07/18/08) Document Released: 07/27/2006 Document Revised: 12/22/2011 Document Reviewed: 07/18/2008 ExitCare Patient Information 2014 [employer].   

## 2013-06-07 NOTE — Progress Notes (Signed)
Veronica Wells Der Wadie Lessen 27-Feb-1936 [id number]   History:    77 y.o.  4 GYN followup exam. Review of patient's records indicated that she has been followed for a long time for her osteopenia. She took Actonel for 6 years and has been on a drug called A.. Her last bone density study was in 2013 and her lowest T. Score was at the left distal radius with a value -1.7 overall stable with no blood loss reported. She is taking her calcium and vitamin D. Patient denies any prior history of abnormal Pap smear. Patient has had a history of melanoma on the right arm in 2003 and is followed by dermatologist here in [place] and also in [place]. Patient's last mammogram was normal although dense in 2013. Patient has had history of benign colon polyp removed in 2010 her mother had history of colon cancer. Patient has not received the Tdap were shingles vaccine. Patient is on no hormone replacement therapy.  Past medical history,surgical history, family history and social history were all reviewed and documented in the EPIC chart.  Gynecologic History No LMP recorded. Patient is postmenopausal. Contraception: post menopausal status Last Pap: 2012. Results were: normal Last mammogram: 2013. Results were: normal but dense  Obstetric History OB History  Gravida Para Term Preterm AB SAB TAB Ectopic Multiple Living  3 3 3       3     # Outcome Date GA Lbr Len/2nd Weight Sex Delivery Anes PTL Lv  3 TRM           2 TRM           1 TRM                ROS: A ROS was performed and pertinent positives and negatives are included in the history.  GENERAL: No fevers or chills. HEENT: No change in vision, no earache, sore throat or sinus congestion. NECK: No pain or stiffness. CARDIOVASCULAR: No chest pain or pressure. No palpitations. PULMONARY: No shortness of breath, cough or wheeze. GASTROINTESTINAL: No abdominal pain, nausea, vomiting or diarrhea, melena or bright red blood per rectum. GENITOURINARY: No  urinary frequency, urgency, hesitancy or dysuria. MUSCULOSKELETAL: No joint or muscle pain, no back pain, no recent trauma. DERMATOLOGIC: No rash, no itching, no lesions. ENDOCRINE: No polyuria, polydipsia, no heat or cold intolerance. No recent change in weight. HEMATOLOGICAL: No anemia or easy bruising or bleeding. NEUROLOGIC: No headache, seizures, numbness, tingling or weakness. PSYCHIATRIC: No depression, no loss of interest in normal activity or change in sleep pattern.     Exam: chaperone present  BP 126/78  Ht 4\' 10"  (1.473 m)  Wt 99 lb (44.906 kg)  BMI 20.7 kg/m2  Body mass index is 20.7 kg/(m^2).  General appearance : Well developed well nourished female. No acute distress HEENT: Neck supple, trachea midline, no carotid bruits, no thyroidmegaly Lungs: Clear to auscultation, no rhonchi or wheezes, or rib retractions  Heart: Regular rate and rhythm, no murmurs or gallops Breast:Examined in sitting and supine position were symmetrical in appearance, no palpable masses or tenderness,  no skin retraction, no nipple inversion, no nipple discharge, no skin discoloration,left mobile axillary mass measuring 3 x 4 cm slightly tender Abdomen: no palpable masses or tenderness, no rebound or guarding Extremities: no edema or skin discoloration or tenderness  Pelvic:  Bartholin, Urethra, Skene Glands: Within normal limits             Vagina: No gross lesions or discharge,  atrophic changes  Cervix: No gross lesions or discharge  Uterus  anteverted, normal size, shape and consistency, non-tender and mobile  Adnexa  Without masses or tenderness  Anus and perineum  normal   Rectovaginal  normal sphincter tone without palpated masses or tenderness, prolapsed internal hemorrhoid             Hemoccult course provided     Assessment/Plan:  77 y.o. female who was found to have a large 3 x 4 cm left axillary mobile tender mass questionable epidermal inclusion cyst versus enlarged lymph node.  Patient will be scheduled for diagnostic mammogram and ultrasound and then will be referred to the general surgeon for further assessment. Also I would like for them to take a look at her prolapsed internal hemorrhoid. Patient will be due for a colonoscopy next year. She was encouraged to continue her calcium and vitamin D and regular exercise for osteoporosis prevention. Her primary physician has been drawn her blood work. She did receive the Tdap vaccine today. Prescription for shingles vaccine provided as well. Pap smear not done today in accordance to the new guidelines.    Ok Edwards MD, 3:07 PM 06/07/2013

## 2013-06-09 ENCOUNTER — Telehealth: Payer: Self-pay

## 2013-06-09 NOTE — Telephone Encounter (Signed)
Per Dr Glenetta Hew Bilateral Diag Mammo and L br u/s scheduled at [facility] for 06/14/13 10:45am.  Appt with Dr. Rayburn Ma at [facility] scheduled for 06/17/13 3:30pm pt to check in 2:50pm.  Patient was advised of al of this.

## 2013-06-10 ENCOUNTER — Ambulatory Visit (INDEPENDENT_AMBULATORY_CARE_PROVIDER_SITE_OTHER): Payer: Self-pay | Admitting: General Surgery

## 2013-06-15 ENCOUNTER — Encounter: Payer: Self-pay | Admitting: Gynecology

## 2013-06-16 ENCOUNTER — Telehealth: Payer: Self-pay | Admitting: *Deleted

## 2013-06-16 NOTE — Telephone Encounter (Signed)
Pt has breast diag. Done at [facility] on 06/14/13 pt asked if she should keep her appointment for general surgeon today. Pt informed yes keep schedule appointment time.

## 2013-06-17 ENCOUNTER — Encounter (INDEPENDENT_AMBULATORY_CARE_PROVIDER_SITE_OTHER): Payer: Self-pay | Admitting: Surgery

## 2013-06-17 ENCOUNTER — Ambulatory Visit (INDEPENDENT_AMBULATORY_CARE_PROVIDER_SITE_OTHER): Payer: Medicare Other | Admitting: Surgery

## 2013-06-17 VITALS — BP 110/60 | HR 62 | Temp 97.0°F | Resp 14 | Ht <= 58 in | Wt 98.6 lb

## 2013-06-17 DIAGNOSIS — K648 Other hemorrhoids: Secondary | ICD-10-CM

## 2013-06-17 DIAGNOSIS — D1739 Benign lipomatous neoplasm of skin and subcutaneous tissue of other sites: Secondary | ICD-10-CM

## 2013-06-17 DIAGNOSIS — D1720 Benign lipomatous neoplasm of skin and subcutaneous tissue of unspecified limb: Secondary | ICD-10-CM

## 2013-06-17 NOTE — Progress Notes (Signed)
Patient ID: Veronica Wells, female   DOB: May 17, 1936, 77 y.o.   MRN: [id number]  Chief Complaint  Patient presents with  . Other    hemorrhoid and breast mass    HPI Veronica Wells Veronica Wells is a 77 y.o. female.   HPI This is a very pleasant female referred to me by Dr. Reynaldo Minium for evaluation of a small mass in her left axilla as well as a prolapsed internal hemorrhoid. She actually only recently noticed a mass. It causes no discomfort. She has had no previous similar masses. Regarding her hemorrhoid, she reports minimal discomfort and swelling on occasion and occasional bleeding. She has no continence issues. She is otherwise without complaints Past Medical History  Diagnosis Date  . Osteopenia   . Cancer 2003    melanoma  . Headaches, cluster   . Hemorrhoids     Past Surgical History  Procedure Laterality Date  . Tubal ligation    . Knee surgery  2011    Replacement  . Melanoma excision      Family History  Problem Relation Age of Onset  . Cancer Mother     Colon  . Osteoporosis Mother   . Heart disease Father   . Heart failure Father   . Osteoporosis Maternal Aunt   . Breast cancer Paternal Aunt     Age50's  . Breast cancer Cousin     Age 60's  . Breast cancer Paternal Aunt     Age 51's    Social History History  Substance Use Topics  . Smoking status: Former Games developer  . Smokeless tobacco: Never Used  . Alcohol Use: 1.5 oz/week    3 drink(s) per week    No Known Allergies  Current Outpatient Prescriptions  Medication Sig Dispense Refill  . aspirin 81 MG tablet Take 81 mg by mouth daily.      . Calcium Carbonate-Vitamin D (CALCIUM + D PO) Take by mouth daily.        . Divalproex Sodium (DEPAKOTE PO) Take by mouth as needed.        . eszopiclone (LUNESTA) 2 MG TABS TAKE ONE TABLET BY MOUTH ONCE DAILY AT BEDTIME  30 tablet  1  . risedronate (ACTONEL) 150 MG tablet Take 150 mg by mouth every 30 (thirty) days. with water on empty stomach,  nothing by mouth or lie down for next 30 minutes.        No current facility-administered medications for this visit.    Review of Systems Review of Systems  Constitutional: Negative for fever, chills and unexpected weight change.  HENT: Negative for hearing loss, congestion, sore throat, trouble swallowing and voice change.   Eyes: Negative for visual disturbance.  Respiratory: Negative for cough and wheezing.   Cardiovascular: Negative for chest pain, palpitations and leg swelling.  Gastrointestinal: Positive for blood in stool and rectal pain. Negative for nausea, vomiting, abdominal pain, diarrhea, constipation, abdominal distention and anal bleeding.  Genitourinary: Negative for hematuria, vaginal bleeding and difficulty urinating.  Musculoskeletal: Negative for arthralgias.  Skin: Negative for rash and wound.  Neurological: Negative for seizures, syncope and headaches.  Hematological: Negative for adenopathy. Does not bruise/bleed easily.  Psychiatric/Behavioral: Negative for confusion.    Blood pressure 110/60, pulse 62, temperature 97 F (36.1 C), temperature source Temporal, resp. rate 14, height 4\' 10"  (1.473 m), weight 98 lb 9.6 oz (44.725 kg).  Physical Exam Physical Exam  Constitutional: She is oriented to person, place, and time.  She appears well-developed and well-nourished. No distress.  HENT:  Head: Normocephalic and atraumatic.  Right Ear: External ear normal.  Left Ear: External ear normal.  Nose: Nose normal.  Mouth/Throat: Oropharynx is clear and moist. No oropharyngeal exudate.  Eyes: Conjunctivae are normal. Pupils are equal, round, and reactive to light. Right eye exhibits no discharge. Left eye exhibits no discharge. No scleral icterus.  Neck: Normal range of motion. Neck supple. No tracheal deviation present.  Cardiovascular: Normal rate, regular rhythm, normal heart sounds and intact distal pulses.   No murmur heard. Pulmonary/Chest: Effort normal and  breath sounds normal. No respiratory distress. She has no wheezes. She has no rales.  Genitourinary:  She has several external hemorrhoidal skin tags and moderate sized internal hemorrhoids on digital examination. There are no masses or abnormalities. Sphincter tone is normal  Musculoskeletal: Normal range of motion. She exhibits no edema and no tenderness.  Lymphadenopathy:    She has no cervical adenopathy.  Neurological: She is alert and oriented to person, place, and time.  Skin: Skin is warm and dry. She is not diaphoretic. No erythema.  There is a 4 cm mass in her left axilla which is soft and mobile. It is nontender and consistent with a lipoma  Psychiatric: Her behavior is normal. Judgment normal.    Data Reviewed I have reviewed the notes from her primary care physician. I have reviewed her mammogram and ultrasound. The mass is consistent with a lipoma in the left axilla  Assessment    #1 left axillary lipoma #2 prolapsed internal hemorrhoid     Plan    I had a discussion with the patient on the diagnosis. She is not at all interested in surgery for either one of these problems. She was to continue conservative management of her hemorrhoids which I feel is very reasonable. As I suspect this is a benign lipoma, I also agree this can be followed conservatively. Should she change her mind or have increasing symptoms, I will see her back. If not I will see her as needed        Johaan Ryser A 06/17/2013, 3:49 PM

## 2013-06-23 ENCOUNTER — Other Ambulatory Visit: Payer: PRIVATE HEALTH INSURANCE | Admitting: Anesthesiology

## 2013-06-23 DIAGNOSIS — Z1211 Encounter for screening for malignant neoplasm of colon: Secondary | ICD-10-CM

## 2013-12-06 DIAGNOSIS — G44009 Cluster headache syndrome, unspecified, not intractable: Secondary | ICD-10-CM | POA: Diagnosis not present

## 2014-02-16 DIAGNOSIS — R51 Headache: Secondary | ICD-10-CM | POA: Diagnosis not present

## 2014-05-11 ENCOUNTER — Encounter: Payer: Self-pay | Admitting: Internal Medicine

## 2014-05-29 ENCOUNTER — Encounter: Payer: Self-pay | Admitting: Internal Medicine

## 2014-06-05 DIAGNOSIS — D692 Other nonthrombocytopenic purpura: Secondary | ICD-10-CM | POA: Diagnosis not present

## 2014-06-05 DIAGNOSIS — L570 Actinic keratosis: Secondary | ICD-10-CM | POA: Diagnosis not present

## 2014-06-05 DIAGNOSIS — Z85828 Personal history of other malignant neoplasm of skin: Secondary | ICD-10-CM | POA: Diagnosis not present

## 2014-06-05 DIAGNOSIS — L819 Disorder of pigmentation, unspecified: Secondary | ICD-10-CM | POA: Diagnosis not present

## 2014-06-05 DIAGNOSIS — Z85820 Personal history of malignant melanoma of skin: Secondary | ICD-10-CM | POA: Diagnosis not present

## 2014-06-05 DIAGNOSIS — L821 Other seborrheic keratosis: Secondary | ICD-10-CM | POA: Diagnosis not present

## 2014-06-07 DIAGNOSIS — M1610 Unilateral primary osteoarthritis, unspecified hip: Secondary | ICD-10-CM | POA: Diagnosis not present

## 2014-06-15 ENCOUNTER — Other Ambulatory Visit: Payer: Self-pay | Admitting: *Deleted

## 2014-06-15 DIAGNOSIS — M8580 Other specified disorders of bone density and structure, unspecified site: Secondary | ICD-10-CM

## 2014-06-20 DIAGNOSIS — R928 Other abnormal and inconclusive findings on diagnostic imaging of breast: Secondary | ICD-10-CM | POA: Diagnosis not present

## 2014-06-20 DIAGNOSIS — M949 Disorder of cartilage, unspecified: Secondary | ICD-10-CM | POA: Diagnosis not present

## 2014-06-20 DIAGNOSIS — Z803 Family history of malignant neoplasm of breast: Secondary | ICD-10-CM | POA: Diagnosis not present

## 2014-06-20 DIAGNOSIS — M899 Disorder of bone, unspecified: Secondary | ICD-10-CM | POA: Diagnosis not present

## 2014-06-20 DIAGNOSIS — Z09 Encounter for follow-up examination after completed treatment for conditions other than malignant neoplasm: Secondary | ICD-10-CM | POA: Diagnosis not present

## 2014-06-20 DIAGNOSIS — N63 Unspecified lump in unspecified breast: Secondary | ICD-10-CM | POA: Diagnosis not present

## 2014-06-22 ENCOUNTER — Other Ambulatory Visit: Payer: Self-pay | Admitting: *Deleted

## 2014-06-22 DIAGNOSIS — M8580 Other specified disorders of bone density and structure, unspecified site: Secondary | ICD-10-CM

## 2014-06-27 ENCOUNTER — Encounter: Payer: Self-pay | Admitting: Gynecology

## 2014-06-27 ENCOUNTER — Ambulatory Visit (AMBULATORY_SURGERY_CENTER): Payer: Self-pay

## 2014-06-27 VITALS — Ht 59.0 in | Wt 99.2 lb

## 2014-06-27 DIAGNOSIS — Z800 Family history of malignant neoplasm of digestive organs: Secondary | ICD-10-CM

## 2014-06-27 DIAGNOSIS — Z860100 Personal history of colon polyps, unspecified: Secondary | ICD-10-CM

## 2014-06-27 MED ORDER — MOVIPREP 100 G PO SOLR: ORAL | Status: DC

## 2014-06-27 NOTE — Progress Notes (Signed)
Per pt, no allergies to soy or egg products.Pt not taking any weight loss meds or using  O2 at home. 

## 2014-07-11 ENCOUNTER — Ambulatory Visit (AMBULATORY_SURGERY_CENTER): Payer: Medicare Other | Admitting: Internal Medicine

## 2014-07-11 ENCOUNTER — Encounter: Payer: Self-pay | Admitting: Internal Medicine

## 2014-07-11 VITALS — BP 159/75 | HR 58 | Temp 96.7°F | Resp 16 | Ht 59.0 in | Wt 99.0 lb

## 2014-07-11 DIAGNOSIS — G44009 Cluster headache syndrome, unspecified, not intractable: Secondary | ICD-10-CM | POA: Diagnosis not present

## 2014-07-11 DIAGNOSIS — Z86010 Personal history of colonic polyps: Secondary | ICD-10-CM | POA: Diagnosis not present

## 2014-07-11 DIAGNOSIS — D126 Benign neoplasm of colon, unspecified: Secondary | ICD-10-CM | POA: Diagnosis not present

## 2014-07-11 DIAGNOSIS — Z1211 Encounter for screening for malignant neoplasm of colon: Secondary | ICD-10-CM | POA: Diagnosis not present

## 2014-07-11 DIAGNOSIS — Z800 Family history of malignant neoplasm of digestive organs: Secondary | ICD-10-CM | POA: Diagnosis not present

## 2014-07-11 DIAGNOSIS — D123 Benign neoplasm of transverse colon: Secondary | ICD-10-CM

## 2014-07-11 MED ORDER — SODIUM CHLORIDE 0.9 % IV SOLN: 500.0000 mL | INTRAVENOUS | Status: DC

## 2014-07-11 NOTE — Progress Notes (Signed)
No problems noted in the recovery room. maw 

## 2014-07-11 NOTE — Progress Notes (Signed)
Called to room to assist during endoscopic procedure.  Patient ID and intended procedure confirmed with present staff. Received instructions for my participation in the procedure from the performing physician.  

## 2014-07-11 NOTE — Patient Instructions (Signed)

## 2014-07-11 NOTE — Op Note (Signed)
[facility]  [address]. [place], [id number]   COLONOSCOPY PROCEDURE REPORT  PATIENT: Veronica Wells, Forster  MR#: #[id number] BIRTHDATE: [birth year] , 68  yrs. old GENDER: female ENDOSCOPIST: Eustace Quail, MD REFERRED BW:[id number] Program Recall PROCEDURE DATE:  07/11/2014 PROCEDURE:   Colonoscopy with snare polypectomy x1 First Screening Colonoscopy - Avg.  risk and is 50 yrs.  old or older - No.  Prior Negative Screening - Now for repeat screening. N/A  History of Adenoma - Now for follow-up colonoscopy & has been > or = to 3 yrs.  N/A  Polyps Removed Today? Yes. ASA CLASS:   Class II INDICATIONS:patient's immediate family history of colon cancer(mother, 38s); multiple prior colonoscopies in 1995, 2000, 2005, 2010 - no adenomatous polyps. MEDICATIONS: Monitored anesthesia care and Propofol 200 mg IV  DESCRIPTION OF PROCEDURE:   After the risks benefits and alternatives of the procedure were thoroughly explained, informed consent was obtained.  The digital rectal exam revealed no abnormalities of the rectum.   The LB [id number]-H190 [id number]  endoscope was introduced through the anus and advanced to the cecum, which was identified by both the appendix and ileocecal valve. No adverse events experienced.   The quality of the prep was excellent, using MoviPrep  The instrument was then slowly withdrawn as the colon was fully examined.  COLON FINDINGS: A single polyp measuring 2 mm in size was found in the transverse colon.  A polypectomy was performed with a cold snare.  The resection was complete, the polyp tissue was completely retrieved and sent to histology.   There was moderate diverticulosis noted in the ascending colon and sigmoid colon. The colon mucosa was otherwise normal.  Retroflexed views revealed internal hemorrhoids. The time to cecum=4 minutes 29 seconds. Withdrawal time=14 minutes 39 seconds.  The scope was withdrawn and the procedure  completed. COMPLICATIONS: There were no immediate complications.  ENDOSCOPIC IMPRESSION: 1.   Single polyp measuring 2 mm in size was found in the transverse colon; polypectomy was performed with a cold snare 2.   Moderate diverticulosis was noted in the ascending colon and sigmoid colon 3.   The colon mucosa was otherwise normal  RECOMMENDATIONS: 1. Return to the care of your primary provider.  GI follow up as needed  eSigned:  Eustace Quail, MD 07/11/2014 3:01 PM   cc: The Patient

## 2014-07-11 NOTE — Progress Notes (Signed)
Report to PACU, RN, vss, BBS= Clear.  

## 2014-07-12 ENCOUNTER — Telehealth: Payer: Self-pay

## 2014-07-12 NOTE — Telephone Encounter (Signed)
  Follow up Call-  Call back number 07/11/2014  Post procedure Call Back phone  # [phone]  Permission to leave phone message Yes     Patient questions:  Do you have a fever, pain , or abdominal swelling? No. Pain Score  0 *  Have you tolerated food without any problems? Yes.    Have you been able to return to your normal activities? Yes.    Do you have any questions about your discharge instructions: Diet   No. Medications  No. Follow up visit  No.  Do you have questions or concerns about your Care? No.  Actions: * If pain score is 4 or above: No action needed, pain <4.  Per the pt "everything is fine". maw

## 2014-07-17 ENCOUNTER — Encounter: Payer: Self-pay | Admitting: Internal Medicine

## 2014-08-14 ENCOUNTER — Encounter: Payer: Self-pay | Admitting: Internal Medicine

## 2014-12-08 DIAGNOSIS — G44009 Cluster headache syndrome, unspecified, not intractable: Secondary | ICD-10-CM | POA: Diagnosis not present

## 2014-12-22 DIAGNOSIS — H2513 Age-related nuclear cataract, bilateral: Secondary | ICD-10-CM | POA: Diagnosis not present

## 2015-03-15 ENCOUNTER — Encounter: Payer: Self-pay | Admitting: Gastroenterology

## 2015-03-15 ENCOUNTER — Encounter: Payer: Self-pay | Admitting: Internal Medicine

## 2015-04-20 DIAGNOSIS — M1611 Unilateral primary osteoarthritis, right hip: Secondary | ICD-10-CM | POA: Diagnosis not present

## 2015-04-20 DIAGNOSIS — M25551 Pain in right hip: Secondary | ICD-10-CM | POA: Diagnosis not present

## 2015-04-24 ENCOUNTER — Other Ambulatory Visit: Payer: Self-pay | Admitting: Physician Assistant

## 2015-04-24 DIAGNOSIS — M1611 Unilateral primary osteoarthritis, right hip: Secondary | ICD-10-CM

## 2015-04-30 ENCOUNTER — Ambulatory Visit
Admission: RE | Admit: 2015-04-30 | Discharge: 2015-04-30 | Disposition: A | Discharge status: Home / Self Care | Payer: Medicare Other | Source: Ambulatory Visit | Attending: Physician Assistant | Admitting: Physician Assistant

## 2015-04-30 DIAGNOSIS — M1611 Unilateral primary osteoarthritis, right hip: Secondary | ICD-10-CM

## 2015-04-30 DIAGNOSIS — M25551 Pain in right hip: Secondary | ICD-10-CM | POA: Diagnosis not present

## 2015-04-30 MED ORDER — IOHEXOL 180 MG/ML  SOLN
1.0000 mL | Freq: Once | INTRAMUSCULAR | Status: AC | PRN
  Administered 2015-04-30: 1 mL via INTRA_ARTICULAR

## 2015-04-30 MED ORDER — METHYLPREDNISOLONE ACETATE 40 MG/ML INJ SUSP (RADIOLOG
120.0000 mg | Freq: Once | INTRAMUSCULAR | Status: AC
  Administered 2015-04-30: 120 mg via INTRA_ARTICULAR

## 2015-05-29 ENCOUNTER — Ambulatory Visit: Payer: Medicare Other | Admitting: Adult Health

## 2015-05-31 ENCOUNTER — Ambulatory Visit: Payer: Medicare Other | Admitting: Adult Health

## 2015-06-04 DIAGNOSIS — M1611 Unilateral primary osteoarthritis, right hip: Secondary | ICD-10-CM | POA: Diagnosis not present

## 2015-06-07 DIAGNOSIS — Z85828 Personal history of other malignant neoplasm of skin: Secondary | ICD-10-CM | POA: Diagnosis not present

## 2015-06-07 DIAGNOSIS — Z85820 Personal history of malignant melanoma of skin: Secondary | ICD-10-CM | POA: Diagnosis not present

## 2015-06-07 DIAGNOSIS — D2371 Other benign neoplasm of skin of right lower limb, including hip: Secondary | ICD-10-CM | POA: Diagnosis not present

## 2015-06-07 DIAGNOSIS — L812 Freckles: Secondary | ICD-10-CM | POA: Diagnosis not present

## 2015-06-07 DIAGNOSIS — L7211 Pilar cyst: Secondary | ICD-10-CM | POA: Diagnosis not present

## 2015-06-07 DIAGNOSIS — L821 Other seborrheic keratosis: Secondary | ICD-10-CM | POA: Diagnosis not present

## 2015-06-07 DIAGNOSIS — L814 Other melanin hyperpigmentation: Secondary | ICD-10-CM | POA: Diagnosis not present

## 2015-06-12 NOTE — Patient Instructions (Addendum)
YOUR PROCEDURE IS SCHEDULED ON : 06/19/15  REPORT TO [place] MAIN ENTRANCE FOLLOW SIGNS TO EAST ELEVATOR - GO TO 3rd FLOOR CHECK IN AT 3 EAST NURSES STATION (SHORT STAY) AT:  5:30 AM  CALL THIS NUMBER IF YOU HAVE PROBLEMS THE MORNING OF SURGERY [phone]  REMEMBER:ONLY 1 PER PERSON MAY GO TO SHORT STAY WITH YOU TO GET READY THE MORNING OF YOUR SURGERY  DO NOT EAT FOOD OR DRINK LIQUIDS AFTER MIDNIGHT  TAKE THESE MEDICINES THE MORNING OF SURGERY:NONE  YOU MAY NOT HAVE ANY METAL ON YOUR BODY INCLUDING HAIR PINS AND PIERCING'S. DO NOT WEAR JEWELRY, MAKEUP, LOTIONS, POWDERS OR PERFUMES. DO NOT WEAR NAIL POLISH. DO NOT SHAVE 48 HRS PRIOR TO SURGERY. MEN MAY SHAVE FACE AND NECK.  DO NOT [place]. [facility] IS NOT RESPONSIBLE FOR VALUABLES.  CONTACTS, DENTURES OR PARTIALS MAY NOT BE WORN TO SURGERY. LEAVE SUITCASE IN CAR. CAN BE BROUGHT TO ROOM AFTER SURGERY.  PATIENTS DISCHARGED THE DAY OF SURGERY WILL NOT BE ALLOWED TO DRIVE HOME.  PLEASE READ OVER THE FOLLOWING INSTRUCTION SHEETS _________________________________________________________________________________                                          Veronica Wells - PREPARING FOR SURGERY  Before surgery, you can play an important role.  Because skin is not sterile, your skin needs to be as free of germs as possible.  You can reduce the number of germs on your skin by washing with CHG (chlorahexidine gluconate) soap before surgery.  CHG is an antiseptic cleaner which kills germs and bonds with the skin to continue killing germs even after washing. Please DO NOT use if you have an allergy to CHG or antibacterial soaps.  If your skin becomes reddened/irritated stop using the CHG and inform your nurse when you arrive at Short Stay. Do not shave (including legs and underarms) for at least 48 hours prior to the first CHG shower.  You may shave your face. Please follow these instructions  carefully:   1.  Shower with CHG Soap the night before surgery and the  morning of Surgery.   2.  If you choose to wash your hair, wash your hair first as usual with your  normal  Shampoo.   3.  After you shampoo, rinse your hair and body thoroughly to remove the  shampoo.                                         4.  Use CHG as you would any other liquid soap.  You can apply chg directly  to the skin and wash . Gently wash with scrungie or clean wascloth    5.  Apply the CHG Soap to your body ONLY FROM THE NECK DOWN.   Do not use on open                           Wound or open sores. Avoid contact with eyes, ears mouth and genitals (private parts).                        Genitals (private parts) with your normal soap.  6.  Wash thoroughly, paying special attention to the area where your surgery  will be performed.   7.  Thoroughly rinse your body with warm water from the neck down.   8.  DO NOT shower/wash with your normal soap after using and rinsing off  the CHG Soap .                9.  Pat yourself dry with a clean towel.             10.  Wear clean night clothes to bed after shower             11.  Place clean sheets on your bed the night of your first shower and do not  sleep with pets.  Day of Surgery : Do not apply any lotions/deodorants the morning of surgery.  Please wear clean clothes to the hospital/surgery center.  FAILURE TO FOLLOW THESE INSTRUCTIONS MAY RESULT IN THE CANCELLATION OF YOUR SURGERY    PATIENT SIGNATURE_________________________________  ______________________________________________________________________     Veronica Wells  An incentive spirometer is a tool that can help keep your lungs clear and active. This tool measures how well you are filling your lungs with each breath. Taking long deep breaths may help reverse or decrease the chance of developing breathing (pulmonary) problems (especially infection) following:  A long  period of time when you are unable to move or be active. BEFORE THE PROCEDURE   If the spirometer includes an indicator to show your best effort, your nurse or respiratory therapist will set it to a desired goal.  If possible, sit up straight or lean slightly forward. Try not to slouch.  Hold the incentive spirometer in an upright position. INSTRUCTIONS FOR USE   Sit on the edge of your bed if possible, or sit up as far as you can in bed or on a chair.  Hold the incentive spirometer in an upright position.  Breathe out normally.  Place the mouthpiece in your mouth and seal your lips tightly around it.  Breathe in slowly and as deeply as possible, raising the piston or the ball toward the top of the column.  Hold your breath for 3-5 seconds or for as long as possible. Allow the piston or ball to fall to the bottom of the column.  Remove the mouthpiece from your mouth and breathe out normally.  Rest for a few seconds and repeat Steps 1 through 7 at least 10 times every 1-2 hours when you are awake. Take your time and take a few normal breaths between deep breaths.  The spirometer may include an indicator to show your best effort. Use the indicator as a goal to work toward during each repetition.  After each set of 10 deep breaths, practice coughing to be sure your lungs are clear. If you have an incision (the cut made at the time of surgery), support your incision when coughing by placing a pillow or rolled up towels firmly against it. Once you are able to get out of bed, walk around indoors and cough well. You may stop using the incentive spirometer when instructed by your caregiver.  RISKS AND COMPLICATIONS  Take your time so you do not get dizzy or light-headed.  If you are in pain, you may need to take or ask for pain medication before doing incentive spirometry. It is harder to take a deep breath if you are having pain. AFTER USE  Rest and breathe slowly and easily.  It can  be helpful to keep track of a log of your progress. Your caregiver can provide you with a simple table to help with this. If you are using the spirometer at home, follow these instructions: [place] IF:   You are having difficultly using the spirometer.  You have trouble using the spirometer as often as instructed.  Your pain medication is not giving enough relief while using the spirometer.  You develop fever of 100.5 F (38.1 C) or higher. SEEK IMMEDIATE MEDICAL CARE IF:   You cough up bloody sputum that had not been present before.  You develop fever of 102 F (38.9 C) or greater.  You develop worsening pain at or near the incision site. MAKE SURE YOU:   Understand these instructions.  Will watch your condition.  Will get help right away if you are not doing well or get worse. Document Released: 02/09/2007 Document Revised: 12/22/2011 Document Reviewed: 04/12/2007 ExitCare Patient Information 2014 ExitCare, [place].   ________________________________________________________________________  WHAT IS A BLOOD TRANSFUSION? Blood Transfusion Information  A transfusion is the replacement of blood or some of its parts. Blood is made up of multiple cells which provide different functions.  Red blood cells carry oxygen and are used for blood loss replacement.  White blood cells fight against infection.  Platelets control bleeding.  Plasma helps clot blood.  Other blood products are available for specialized needs, such as hemophilia or other clotting disorders. BEFORE THE TRANSFUSION  Who gives blood for transfusions?   Healthy volunteers who are fully evaluated to make sure their blood is safe. This is blood bank blood. Transfusion therapy is the safest it has ever been in the practice of medicine. Before blood is taken from a donor, a complete history is taken to make sure that person has no history of diseases nor engages in risky social behavior (examples are  intravenous drug use or sexual activity with multiple partners). The donor's travel history is screened to minimize risk of transmitting infections, such as malaria. The donated blood is tested for signs of infectious diseases, such as HIV and hepatitis. The blood is then tested to be sure it is compatible with you in order to minimize the chance of a transfusion reaction. If you or a relative donates blood, this is often done in anticipation of surgery and is not appropriate for emergency situations. It takes many days to process the donated blood. RISKS AND COMPLICATIONS Although transfusion therapy is very safe and saves many lives, the main dangers of transfusion include:   Getting an infectious disease.  Developing a transfusion reaction. This is an allergic reaction to something in the blood you were given. Every precaution is taken to prevent this. The decision to have a blood transfusion has been considered carefully by your caregiver before blood is given. Blood is not given unless the benefits outweigh the risks. AFTER THE TRANSFUSION  Right after receiving a blood transfusion, you will usually feel much better and more energetic. This is especially true if your red blood cells have gotten low (anemic). The transfusion raises the level of the red blood cells which carry oxygen, and this usually causes an energy increase.  The nurse administering the transfusion will monitor you carefully for complications. HOME CARE INSTRUCTIONS  No special instructions are needed after a transfusion. You may find your energy is better. Speak with your caregiver about any limitations on activity for underlying diseases you may have. SEEK MEDICAL CARE IF:   Your  condition is not improving after your transfusion.  You develop redness or irritation at the intravenous (IV) site. SEEK IMMEDIATE MEDICAL CARE IF:  Any of the following symptoms occur over the next 12 hours:  Shaking chills.  You have a  temperature by mouth above 102 F (38.9 C), not controlled by medicine.  Chest, back, or muscle pain.  People around you feel you are not acting correctly or are confused.  Shortness of breath or difficulty breathing.  Dizziness and fainting.  You get a rash or develop hives.  You have a decrease in urine output.  Your urine turns a dark color or changes to pink, red, or brown. Any of the following symptoms occur over the next 10 days:  You have a temperature by mouth above 102 F (38.9 C), not controlled by medicine.  Shortness of breath.  Weakness after normal activity.  The white part of the eye turns yellow (jaundice).  You have a decrease in the amount of urine or are urinating less often.  Your urine turns a dark color or changes to pink, red, or brown. Document Released: 09/26/2000 Document Revised: 12/22/2011 Document Reviewed: 05/15/2008 [facility] Patient Information 2014 [place], [place].  _______________________________________________________________________

## 2015-06-13 ENCOUNTER — Encounter (HOSPITAL_COMMUNITY): Payer: Self-pay

## 2015-06-13 ENCOUNTER — Encounter (HOSPITAL_COMMUNITY)
Admission: RE | Admit: 2015-06-13 | Discharge: 2015-06-13 | Disposition: A | Discharge status: Home / Self Care | Payer: Medicare Other | Source: Ambulatory Visit | Attending: Orthopedic Surgery | Admitting: Orthopedic Surgery

## 2015-06-13 DIAGNOSIS — Z01818 Encounter for other preprocedural examination: Secondary | ICD-10-CM | POA: Diagnosis not present

## 2015-06-13 DIAGNOSIS — M1611 Unilateral primary osteoarthritis, right hip: Secondary | ICD-10-CM | POA: Insufficient documentation

## 2015-06-13 HISTORY — DX: Other allergy status, other than to drugs and biological substances: Z91.09

## 2015-06-13 HISTORY — DX: Unspecified osteoarthritis, unspecified site: M19.90

## 2015-06-13 HISTORY — DX: Sleep disorder, unspecified: G47.9

## 2015-06-13 LAB — CBC
HCT: 39.6 % (ref 36.0–46.0)
Hemoglobin: 12.7 g/dL (ref 12.0–15.0)
MCH: 30.2 pg (ref 26.0–34.0)
MCHC: 32.1 g/dL (ref 30.0–36.0)
MCV: 94.3 fL (ref 78.0–100.0)
PLATELETS: 286 10*3/uL (ref 150–400)
RBC: 4.2 MIL/uL (ref 3.87–5.11)
RDW: 14.8 % (ref 11.5–15.5)
WBC: 5.1 10*3/uL (ref 4.0–10.5)

## 2015-06-13 LAB — BASIC METABOLIC PANEL
Anion gap: 8 (ref 5–15)
BUN: 32 mg/dL — AB (ref 6–20)
CALCIUM: 9.2 mg/dL (ref 8.9–10.3)
CO2: 25 mmol/L (ref 22–32)
Chloride: 107 mmol/L (ref 101–111)
Creatinine, Ser: 0.74 mg/dL (ref 0.44–1.00)
GFR calc Af Amer: >60 mL/min (ref >60)
Glucose, Bld: 96 mg/dL (ref 65–99)
Potassium: 4.7 mmol/L (ref 3.5–5.1)
SODIUM: 140 mmol/L (ref 135–145)

## 2015-06-13 LAB — PROTIME-INR
INR: 0.98 (ref 0.00–1.49)
Prothrombin Time: 13.2 seconds (ref 11.6–15.2)

## 2015-06-13 LAB — URINALYSIS, ROUTINE W REFLEX MICROSCOPIC
Bilirubin Urine: NEGATIVE
Glucose, UA: NEGATIVE mg/dL
HGB URINE DIPSTICK: NEGATIVE
Ketones, ur: NEGATIVE mg/dL
Leukocytes, UA: NEGATIVE
Nitrite: NEGATIVE
Protein, ur: NEGATIVE mg/dL
Specific Gravity, Urine: 1.021 (ref 1.005–1.030)
UROBILINOGEN UA: 0.2 mg/dL (ref 0.0–1.0)
pH: 5.5 (ref 5.0–8.0)

## 2015-06-13 LAB — APTT: APTT: 30 s (ref 24–37)

## 2015-06-13 LAB — SURGICAL PCR SCREEN
MRSA, PCR: NEGATIVE
STAPHYLOCOCCUS AUREUS: POSITIVE — AB

## 2015-06-13 NOTE — Progress Notes (Signed)
Abnormal BMET faxed to Dr. Olin 

## 2015-06-14 NOTE — H&P (Signed)
TOTAL HIP ADMISSION H&P  Patient is admitted for right total hip arthroplasty, anterior approach.  Subjective:  Chief Complaint:   Right hip primary OA / pain  HPI: Veronica Wells, 79 y.o. female, has a history of pain and functional disability in the right hip(s) due to arthritis and patient has failed non-surgical conservative treatments for greater than 12 weeks to include NSAID's and/or analgesics, corticosteriod injections and activity modification.  Onset of symptoms was gradual starting 1+ years ago with gradually worsening course since that time.The patient noted no past surgery on the right hip(s).  Patient currently rates pain in the right hip at 8 out of 10 with activity. Patient has night pain, worsening of pain with activity and weight bearing, trendelenberg gait, pain that interfers with activities of daily living and pain with passive range of motion. Patient has evidence of periarticular osteophytes and joint space narrowing by imaging studies. This condition presents safety issues increasing the risk of falls. There is no current active infection.  Risks, benefits and expectations were discussed with the patient.  Risks including but not limited to the risk of anesthesia, blood clots, nerve damage, blood vessel damage, failure of the prosthesis, infection and up to and including death.  Patient understand the risks, benefits and expectations and wishes to proceed with surgery.    PCP: PROVIDER NOT IN SYSTEM  D/C Plans:      Home with HHPT  Post-op Meds:       No Rx given   Tranexamic Acid:      To be given - IV  Decadron:      Is to be given  FYI:     ASA post-op  Norco post-op    Patient Active Problem List   Diagnosis Date Noted  . Lipoma of axilla 06/17/2013  . Prolapsed internal hemorrhoids 06/17/2013  . Axillary mass 06/07/2013  . Osteopenia   . Hemorrhoids   . SINUSITIS - ACUTE-NOS 02/21/2010  . OSTEOARTHRITIS, KNEES, BILATERAL 02/21/2010  . HEADACHE  02/21/2010  . COLONIC POLYPS, HX OF 02/21/2010   Past Medical History  Diagnosis Date  . Osteopenia   . Cancer 2003    melanoma on back right arm  . Headaches, cluster   . Hemorrhoids   . Arthritis   . Environmental allergies   . Difficulty sleeping     Past Surgical History  Procedure Laterality Date  . Tubal ligation    . Knee surgery  2011    Replacement/right knee  . Melanoma excision  2003    No prescriptions prior to admission   No Known Allergies   Social History  Substance Use Topics  . Smoking status: Former Smoker    Types: Cigarettes    Quit date: 06/28/1975  . Smokeless tobacco: Never Used  . Alcohol Use: 1.8 oz/week    3 Glasses of wine per week    Family History  Problem Relation Age of Onset  . Cancer Mother     Colon  . Osteoporosis Mother   . Colon cancer Mother   . Heart disease Father   . Heart failure Father   . Osteoporosis Maternal Aunt   . Breast cancer Paternal Aunt     Age50's  . Breast cancer Cousin     Age 66's  . Breast cancer Paternal Aunt     Age 57's  . Stomach cancer Neg Hx      Review of Systems  Constitutional: Negative.   Eyes: Negative.  Respiratory: Negative.   Cardiovascular: Negative.   Gastrointestinal: Negative.   Genitourinary: Negative.   Musculoskeletal: Positive for joint pain.  Skin: Negative.   Neurological: Positive for headaches.  Endo/Heme/Allergies: Positive for environmental allergies.  Psychiatric/Behavioral: Negative.     Objective:  Physical Exam  Constitutional: She is oriented to person, place, and time. She appears well-developed and well-nourished.  HENT:  Head: Normocephalic and atraumatic.  Eyes: Pupils are equal, round, and reactive to light.  Neck: Neck supple. No JVD present. No tracheal deviation present. No thyromegaly present.  Cardiovascular: Normal rate, regular rhythm, normal heart sounds and intact distal pulses.   Respiratory: Effort normal and breath sounds normal. No  stridor. No respiratory distress. She has no wheezes.  GI: Soft. There is no tenderness. There is no guarding.  Musculoskeletal:       Right hip: She exhibits decreased range of motion, decreased strength, tenderness and bony tenderness. She exhibits no swelling, no deformity and no laceration.  Lymphadenopathy:    She has no cervical adenopathy.  Neurological: She is alert and oriented to person, place, and time.  Skin: Skin is warm and dry.  Psychiatric: She has a normal mood and affect.     Labs:  Estimated body mass index is 19.98 kg/(m^2) as calculated from the following:   Height as of 07/11/14: 4\' 11"  (1.499 m).   Weight as of 07/11/14: 44.906 kg (99 lb).   Imaging Review Plain radiographs demonstrate severe degenerative joint disease of the right hip(s). The bone quality appears to be good for age and reported activity level.  Assessment/Plan:  End stage arthritis, right hip(s)  The patient history, physical examination, clinical judgement of the provider and imaging studies are consistent with end stage degenerative joint disease of the right hip(s) and total hip arthroplasty is deemed medically necessary. The treatment options including medical management, injection therapy, arthroscopy and arthroplasty were discussed at length. The risks and benefits of total hip arthroplasty were presented and reviewed. The risks due to aseptic loosening, infection, stiffness, dislocation/subluxation,  thromboembolic complications and other imponderables were discussed.  The patient acknowledged the explanation, agreed to proceed with the plan and consent was signed. Patient is being admitted for inpatient treatment for surgery, pain control, PT, OT, prophylactic antibiotics, VTE prophylaxis, progressive ambulation and ADL's and discharge planning.The patient is planning to be discharged home with home health services.    [place] Dilraj Killgore   PA-C  06/14/2015, 1:22 PM

## 2015-06-15 NOTE — Progress Notes (Signed)
Rx Mupuricin called to [employer] - pt notified

## 2015-06-18 LAB — TYPE AND SCREEN
ABO/RH(D): O POS
Antibody Screen: NEGATIVE

## 2015-06-19 ENCOUNTER — Inpatient Hospital Stay (HOSPITAL_COMMUNITY)
Admission: RE | Admit: 2015-06-19 | Discharge: 2015-06-20 | DRG: 470 | Disposition: A | Discharge status: Home Health | Payer: Medicare Other | Source: Ambulatory Visit | Attending: Orthopedic Surgery | Admitting: Orthopedic Surgery

## 2015-06-19 ENCOUNTER — Inpatient Hospital Stay (HOSPITAL_COMMUNITY): Payer: Medicare Other | Admitting: Anesthesiology

## 2015-06-19 ENCOUNTER — Encounter (HOSPITAL_COMMUNITY)
Admission: RE | Disposition: A | Discharge status: Home Health | Payer: Self-pay | Source: Ambulatory Visit | Attending: Orthopedic Surgery

## 2015-06-19 ENCOUNTER — Encounter (HOSPITAL_COMMUNITY): Payer: Self-pay | Admitting: *Deleted

## 2015-06-19 ENCOUNTER — Inpatient Hospital Stay (HOSPITAL_COMMUNITY): Payer: Medicare Other

## 2015-06-19 DIAGNOSIS — M1611 Unilateral primary osteoarthritis, right hip: Secondary | ICD-10-CM | POA: Diagnosis not present

## 2015-06-19 DIAGNOSIS — M8580 Other specified disorders of bone density and structure, unspecified site: Secondary | ICD-10-CM | POA: Diagnosis present

## 2015-06-19 DIAGNOSIS — M25551 Pain in right hip: Secondary | ICD-10-CM | POA: Diagnosis not present

## 2015-06-19 DIAGNOSIS — Z87891 Personal history of nicotine dependence: Secondary | ICD-10-CM

## 2015-06-19 DIAGNOSIS — Z01812 Encounter for preprocedural laboratory examination: Secondary | ICD-10-CM

## 2015-06-19 DIAGNOSIS — M169 Osteoarthritis of hip, unspecified: Secondary | ICD-10-CM | POA: Diagnosis not present

## 2015-06-19 DIAGNOSIS — Z8262 Family history of osteoporosis: Secondary | ICD-10-CM

## 2015-06-19 DIAGNOSIS — Z85820 Personal history of malignant melanoma of skin: Secondary | ICD-10-CM

## 2015-06-19 DIAGNOSIS — Z96641 Presence of right artificial hip joint: Secondary | ICD-10-CM | POA: Diagnosis not present

## 2015-06-19 DIAGNOSIS — Z471 Aftercare following joint replacement surgery: Secondary | ICD-10-CM | POA: Diagnosis not present

## 2015-06-19 DIAGNOSIS — Z96649 Presence of unspecified artificial hip joint: Secondary | ICD-10-CM

## 2015-06-19 HISTORY — PX: TOTAL HIP ARTHROPLASTY: SHX124

## 2015-06-19 SURGERY — ARTHROPLASTY, HIP, TOTAL, ANTERIOR APPROACH
Anesthesia: Spinal | Site: Hip | Laterality: Right

## 2015-06-19 MED ORDER — LACTATED RINGERS IV SOLN
INTRAVENOUS | Status: DC | PRN
  Administered 2015-06-19 (×2): via INTRAVENOUS

## 2015-06-19 MED ORDER — SODIUM CHLORIDE 0.9 % IJ SOLN
INTRAMUSCULAR | Status: AC
  Filled 2015-06-19: qty 10

## 2015-06-19 MED ORDER — DEXAMETHASONE SODIUM PHOSPHATE 10 MG/ML IJ SOLN
INTRAMUSCULAR | Status: AC
  Filled 2015-06-19: qty 1

## 2015-06-19 MED ORDER — HYDROMORPHONE HCL 1 MG/ML IJ SOLN
0.5000 mg | INTRAMUSCULAR | Status: DC | PRN
  Administered 2015-06-19 (×2): 1 mg via INTRAVENOUS
  Filled 2015-06-19 (×2): qty 1

## 2015-06-19 MED ORDER — ONDANSETRON HCL 4 MG/2ML IJ SOLN
4.0000 mg | Freq: Four times a day (QID) | INTRAMUSCULAR | Status: DC | PRN
Start: 2015-06-19 — End: 2015-06-20
  Administered 2015-06-19: 4 mg via INTRAVENOUS
  Filled 2015-06-19: qty 2

## 2015-06-19 MED ORDER — EPHEDRINE SULFATE 50 MG/ML IJ SOLN
INTRAMUSCULAR | Status: AC
  Filled 2015-06-19: qty 1

## 2015-06-19 MED ORDER — CHLORHEXIDINE GLUCONATE 4 % EX LIQD
60.0000 mL | Freq: Once | CUTANEOUS | Status: DC
Start: 2015-06-19 — End: 2015-06-19

## 2015-06-19 MED ORDER — MAGNESIUM CITRATE PO SOLN: 1.0000 | Freq: Once | ORAL | Status: DC | PRN

## 2015-06-19 MED ORDER — PROPOFOL 10 MG/ML IV BOLUS
INTRAVENOUS | Status: DC | PRN
  Administered 2015-06-19: 30 mg via INTRAVENOUS

## 2015-06-19 MED ORDER — CEFAZOLIN SODIUM-DEXTROSE 2-3 GM-% IV SOLR
2.0000 g | Freq: Four times a day (QID) | INTRAVENOUS | Status: AC
Start: 2015-06-19 — End: 2015-06-19
  Administered 2015-06-19 (×2): 2 g via INTRAVENOUS
  Filled 2015-06-19 (×2): qty 50

## 2015-06-19 MED ORDER — CELECOXIB 200 MG PO CAPS
200.0000 mg | ORAL_CAPSULE | Freq: Two times a day (BID) | ORAL | Status: DC
  Administered 2015-06-19 – 2015-06-20 (×3): 200 mg via ORAL
  Filled 2015-06-19 (×4): qty 1

## 2015-06-19 MED ORDER — LACTATED RINGERS IV SOLN: INTRAVENOUS | Status: DC

## 2015-06-19 MED ORDER — METHOCARBAMOL 500 MG PO TABS
500.0000 mg | ORAL_TABLET | Freq: Four times a day (QID) | ORAL | Status: DC | PRN
  Administered 2015-06-19 (×2): 500 mg via ORAL
  Filled 2015-06-19 (×2): qty 1

## 2015-06-19 MED ORDER — BUPIVACAINE IN DEXTROSE 0.75-8.25 % IT SOLN
INTRATHECAL | Status: DC | PRN
  Administered 2015-06-19: 1.5 mL via INTRATHECAL

## 2015-06-19 MED ORDER — PROPOFOL 10 MG/ML IV BOLUS
INTRAVENOUS | Status: AC
  Filled 2015-06-19: qty 20

## 2015-06-19 MED ORDER — METOCLOPRAMIDE HCL 5 MG/ML IJ SOLN: 5.0000 mg | Freq: Three times a day (TID) | INTRAMUSCULAR | Status: DC | PRN

## 2015-06-19 MED ORDER — ASPIRIN EC 325 MG PO TBEC
325.0000 mg | DELAYED_RELEASE_TABLET | Freq: Two times a day (BID) | ORAL | Status: DC
  Administered 2015-06-20: 325 mg via ORAL
  Filled 2015-06-19 (×3): qty 1

## 2015-06-19 MED ORDER — TRANEXAMIC ACID 1000 MG/10ML IV SOLN
1000.0000 mg | Freq: Once | INTRAVENOUS | Status: AC
  Administered 2015-06-19: 1000 mg via INTRAVENOUS
  Filled 2015-06-19: qty 10

## 2015-06-19 MED ORDER — HYDROCODONE-ACETAMINOPHEN 7.5-325 MG PO TABS
1.0000 | ORAL_TABLET | ORAL | Status: DC
  Administered 2015-06-19: 2 via ORAL
  Administered 2015-06-19: 1 via ORAL
  Administered 2015-06-19 – 2015-06-20 (×2): 2 via ORAL
  Filled 2015-06-19 (×2): qty 2
  Filled 2015-06-19: qty 1
  Filled 2015-06-19 (×2): qty 2

## 2015-06-19 MED ORDER — DOCUSATE SODIUM 100 MG PO CAPS
100.0000 mg | ORAL_CAPSULE | Freq: Two times a day (BID) | ORAL | Status: DC
  Administered 2015-06-19 – 2015-06-20 (×2): 100 mg via ORAL
  Filled 2015-06-19 (×3): qty 1

## 2015-06-19 MED ORDER — PROMETHAZINE HCL 25 MG/ML IJ SOLN: 6.2500 mg | INTRAMUSCULAR | Status: DC | PRN

## 2015-06-19 MED ORDER — SODIUM CHLORIDE 0.9 % IR SOLN
Status: DC | PRN
  Administered 2015-06-19: 1000 mL

## 2015-06-19 MED ORDER — POLYETHYLENE GLYCOL 3350 17 G PO PACK
17.0000 g | PACK | Freq: Two times a day (BID) | ORAL | Status: DC
  Administered 2015-06-20: 17 g via ORAL
  Filled 2015-06-19 (×3): qty 1

## 2015-06-19 MED ORDER — PROPOFOL INFUSION 10 MG/ML OPTIME
INTRAVENOUS | Status: DC | PRN
  Administered 2015-06-19: 50 ug/kg/min via INTRAVENOUS

## 2015-06-19 MED ORDER — MENTHOL 3 MG MT LOZG
1.0000 | LOZENGE | OROMUCOSAL | Status: DC | PRN
  Filled 2015-06-19: qty 9

## 2015-06-19 MED ORDER — ONDANSETRON HCL 4 MG PO TABS: 4.0000 mg | ORAL_TABLET | Freq: Four times a day (QID) | ORAL | Status: DC | PRN

## 2015-06-19 MED ORDER — CEFAZOLIN SODIUM-DEXTROSE 2-3 GM-% IV SOLR
2.0000 g | INTRAVENOUS | Status: AC
  Administered 2015-06-19: 2 g via INTRAVENOUS

## 2015-06-19 MED ORDER — ALUM & MAG HYDROXIDE-SIMETH 200-200-20 MG/5ML PO SUSP: 30.0000 mL | ORAL | Status: DC | PRN

## 2015-06-19 MED ORDER — DIPHENHYDRAMINE HCL 25 MG PO CAPS: 25.0000 mg | ORAL_CAPSULE | Freq: Four times a day (QID) | ORAL | Status: DC | PRN

## 2015-06-19 MED ORDER — SODIUM CHLORIDE 0.9 % IV SOLN
100.0000 mL/h | INTRAVENOUS | Status: DC
  Administered 2015-06-19: 100 mL/h via INTRAVENOUS
  Filled 2015-06-19 (×7): qty 1000

## 2015-06-19 MED ORDER — ONDANSETRON HCL 4 MG/2ML IJ SOLN
INTRAMUSCULAR | Status: AC
  Filled 2015-06-19: qty 2

## 2015-06-19 MED ORDER — METOCLOPRAMIDE HCL 10 MG PO TABS: 5.0000 mg | ORAL_TABLET | Freq: Three times a day (TID) | ORAL | Status: DC | PRN

## 2015-06-19 MED ORDER — DIVALPROEX SODIUM ER 500 MG PO TB24
500.0000 mg | ORAL_TABLET | Freq: Every day | ORAL | Status: DC | PRN
  Filled 2015-06-19: qty 1

## 2015-06-19 MED ORDER — PHENOL 1.4 % MT LIQD: 1.0000 | OROMUCOSAL | Status: DC | PRN

## 2015-06-19 MED ORDER — FERROUS SULFATE 325 (65 FE) MG PO TABS
325.0000 mg | ORAL_TABLET | Freq: Three times a day (TID) | ORAL | Status: DC
  Administered 2015-06-19 – 2015-06-20 (×2): 325 mg via ORAL
  Filled 2015-06-19 (×6): qty 1

## 2015-06-19 MED ORDER — FENTANYL CITRATE (PF) 100 MCG/2ML IJ SOLN: 25.0000 ug | INTRAMUSCULAR | Status: DC | PRN

## 2015-06-19 MED ORDER — DEXAMETHASONE SODIUM PHOSPHATE 10 MG/ML IJ SOLN
10.0000 mg | Freq: Once | INTRAMUSCULAR | Status: AC
  Administered 2015-06-20: 10 mg via INTRAVENOUS
  Filled 2015-06-19: qty 1

## 2015-06-19 MED ORDER — ONDANSETRON HCL 4 MG/2ML IJ SOLN
INTRAMUSCULAR | Status: DC | PRN
  Administered 2015-06-19: 4 mg via INTRAVENOUS

## 2015-06-19 MED ORDER — DEXAMETHASONE SODIUM PHOSPHATE 10 MG/ML IJ SOLN
INTRAMUSCULAR | Status: DC | PRN
  Administered 2015-06-19: 10 mg via INTRAVENOUS

## 2015-06-19 MED ORDER — DEXAMETHASONE SODIUM PHOSPHATE 10 MG/ML IJ SOLN: 10.0000 mg | Freq: Once | INTRAMUSCULAR | Status: DC

## 2015-06-19 MED ORDER — MEPERIDINE HCL 50 MG/ML IJ SOLN: 6.2500 mg | INTRAMUSCULAR | Status: DC | PRN

## 2015-06-19 MED ORDER — CEFAZOLIN SODIUM-DEXTROSE 2-3 GM-% IV SOLR
INTRAVENOUS | Status: AC
  Filled 2015-06-19: qty 50

## 2015-06-19 MED ORDER — EPHEDRINE SULFATE 50 MG/ML IJ SOLN
INTRAMUSCULAR | Status: DC | PRN
  Administered 2015-06-19 (×2): 10 mg via INTRAVENOUS
  Administered 2015-06-19: 15 mg via INTRAVENOUS

## 2015-06-19 MED ORDER — DEXTROSE 5 % IV SOLN
500.0000 mg | Freq: Four times a day (QID) | INTRAVENOUS | Status: DC | PRN
  Filled 2015-06-19: qty 5

## 2015-06-19 MED ORDER — TEMAZEPAM 15 MG PO CAPS
30.0000 mg | ORAL_CAPSULE | Freq: Every evening | ORAL | Status: DC | PRN
  Administered 2015-06-19: 30 mg via ORAL
  Filled 2015-06-19: qty 2

## 2015-06-19 MED ORDER — BISACODYL 10 MG RE SUPP: 10.0000 mg | Freq: Every day | RECTAL | Status: DC | PRN

## 2015-06-19 SURGICAL SUPPLY — 45 items
BAG DECANTER FOR FLEXI CONT (MISCELLANEOUS) IMPLANT
BAG SPEC THK2 15X12 ZIP CLS (MISCELLANEOUS) ×1
BAG ZIPLOCK 12X15 (MISCELLANEOUS) ×2 IMPLANT
CAPT HIP TOTAL 2 ×2 IMPLANT
COVER PERINEAL POST (MISCELLANEOUS) ×3 IMPLANT
DRAPE C-ARM 42X120 X-RAY (DRAPES) ×3 IMPLANT
DRAPE STERI IOBAN 125X83 (DRAPES) ×3 IMPLANT
DRAPE U-SHAPE 47X51 STRL (DRAPES) ×9 IMPLANT
DRSG AQUACEL AG ADV 3.5X10 (GAUZE/BANDAGES/DRESSINGS) ×3 IMPLANT
DURAPREP 26ML APPLICATOR (WOUND CARE) ×3 IMPLANT
ELECT BLADE TIP CTD 4 INCH (ELECTRODE) ×3 IMPLANT
ELECT PENCIL ROCKER SW 15FT (MISCELLANEOUS) IMPLANT
ELECT REM PT RETURN 15FT ADLT (MISCELLANEOUS) IMPLANT
ELECT REM PT RETURN 9FT ADLT (ELECTROSURGICAL) ×3
ELECTRODE REM PT RTRN 9FT ADLT (ELECTROSURGICAL) ×1 IMPLANT
FACESHIELD WRAPAROUND (MASK) ×12 IMPLANT
FACESHIELD WRAPAROUND OR TEAM (MASK) ×4 IMPLANT
GLOVE BIOGEL PI IND STRL 7.5 (GLOVE) ×1 IMPLANT
GLOVE BIOGEL PI IND STRL 8.5 (GLOVE) ×1 IMPLANT
GLOVE BIOGEL PI INDICATOR 7.5 (GLOVE) ×2
GLOVE BIOGEL PI INDICATOR 8.5 (GLOVE) ×2
GLOVE ECLIPSE 8.0 STRL XLNG CF (GLOVE) ×6 IMPLANT
GLOVE ORTHO TXT STRL SZ7.5 (GLOVE) ×3 IMPLANT
GOWN SPEC L3 XXLG W/TWL (GOWN DISPOSABLE) ×3 IMPLANT
GOWN STRL REUS W/TWL LRG LVL3 (GOWN DISPOSABLE) ×3 IMPLANT
HOLDER FOLEY CATH W/STRAP (MISCELLANEOUS) ×3 IMPLANT
KIT BASIN OR (CUSTOM PROCEDURE TRAY) ×3 IMPLANT
LIQUID BAND (GAUZE/BANDAGES/DRESSINGS) ×3 IMPLANT
NDL SAFETY ECLIPSE 18X1.5 (NEEDLE) IMPLANT
NEEDLE HYPO 18GX1.5 SHARP (NEEDLE)
PACK TOTAL JOINT (CUSTOM PROCEDURE TRAY) ×3 IMPLANT
PEN SKIN MARKING BROAD (MISCELLANEOUS) ×3 IMPLANT
SAW OSC TIP CART 19.5X105X1.3 (SAW) ×3 IMPLANT
SUT MNCRL AB 4-0 PS2 18 (SUTURE) ×3 IMPLANT
SUT VIC AB 1 CT1 36 (SUTURE) ×9 IMPLANT
SUT VIC AB 2-0 CT1 27 (SUTURE) ×6
SUT VIC AB 2-0 CT1 TAPERPNT 27 (SUTURE) ×2 IMPLANT
SUT VLOC 180 0 24IN GS25 (SUTURE) ×3 IMPLANT
SYR 50ML LL SCALE MARK (SYRINGE) IMPLANT
TOWEL OR 17X26 10 PK STRL BLUE (TOWEL DISPOSABLE) ×3 IMPLANT
TOWEL OR NON WOVEN STRL DISP B (DISPOSABLE) ×2 IMPLANT
TRAY FOLEY W/METER SILVER 14FR (SET/KITS/TRAYS/PACK) ×3 IMPLANT
TRAY FOLEY W/METER SILVER 16FR (SET/KITS/TRAYS/PACK) ×1 IMPLANT
WATER STERILE IRR 1500ML POUR (IV SOLUTION) ×3 IMPLANT
YANKAUER SUCT BULB TIP 10FT TU (MISCELLANEOUS) ×3 IMPLANT

## 2015-06-19 NOTE — Transfer of Care (Signed)
Immediate Anesthesia Transfer of Care Note  Patient: Veronica Wells [name]  Procedure(s) Performed: Procedure(s): RIGHT TOTAL HIP ARTHROPLASTY ANTERIOR APPROACH (Right)  Patient Location: PACU  Anesthesia Type:MAC and Spinal  Level of Consciousness: awake, alert , oriented and patient cooperative  Airway & Oxygen Therapy: Patient Spontanous Breathing and Patient connected to face mask oxygen  Post-op Assessment: Report given to RN and Post -op Vital signs reviewed and stable  Post vital signs: Reviewed and stable  Last Vitals:  Filed Vitals:   06/19/15 0535  BP: 126/78  Pulse: 69  Temp: 36.3 C  Resp: 16    Complications: No apparent anesthesia complications

## 2015-06-19 NOTE — Anesthesia Preprocedure Evaluation (Signed)
Anesthesia Evaluation  Patient identified by MRN, date of birth, ID band Patient awake    Reviewed: Allergy & Precautions, NPO status , Patient's Chart, lab work & pertinent test results  Airway Mallampati: II  TM Distance: >3 FB Neck ROM: Full    Dental no notable dental hx.    Pulmonary former smoker,  breath sounds clear to auscultation  Pulmonary exam normal       Cardiovascular negative cardio ROS Normal cardiovascular examRhythm:Regular Rate:Normal     Neuro/Psych negative neurological ROS  negative psych ROS   GI/Hepatic negative GI ROS, Neg liver ROS,   Endo/Other  negative endocrine ROS  Renal/GU negative Renal ROS  negative genitourinary   Musculoskeletal negative musculoskeletal ROS (+)   Abdominal   Peds negative pediatric ROS (+)  Hematology negative hematology ROS (+)   Anesthesia Other Findings   Reproductive/Obstetrics negative OB ROS                             Anesthesia Physical Anesthesia Plan  ASA: II  Anesthesia Plan: Spinal   Post-op Pain Management:    Induction:   Airway Management Planned: Simple Face Mask  Additional Equipment:   Intra-op Plan:   Post-operative Plan:   Informed Consent: I have reviewed the patients History and Physical, chart, labs and discussed the procedure including the risks, benefits and alternatives for the proposed anesthesia with the patient or authorized representative who has indicated his/her understanding and acceptance.   Dental advisory given  Plan Discussed with: CRNA  Anesthesia Plan Comments:         Anesthesia Quick Evaluation

## 2015-06-19 NOTE — Interval H&P Note (Signed)
History and Physical Interval Note:  06/19/2015 7:15 AM  Veronica Wells  has presented today for surgery, with the diagnosis of RIGHT HIP OA   The various methods of treatment have been discussed with the patient and family. After consideration of risks, benefits and other options for treatment, the patient has consented to  Procedure(s): RIGHT TOTAL HIP ARTHROPLASTY ANTERIOR APPROACH (Right) as a surgical intervention .  The patient's history has been reviewed, patient examined, no change in status, stable for surgery.  I have reviewed the patient's chart and labs.  Questions were answered to the patient's satisfaction.     Mauri Pole

## 2015-06-19 NOTE — Plan of Care (Signed)
Problem: Consults Goal: Diagnosis- Total Joint Replacement Primary Total Hip     

## 2015-06-19 NOTE — Anesthesia Procedure Notes (Addendum)
Procedure Name: MAC Date/Time: 06/19/2015 8:28 AM Performed by: Dione Booze Pre-anesthesia Checklist: Patient identified, Emergency Drugs available, Suction available and Patient being monitored Patient Re-evaluated:Patient Re-evaluated prior to inductionOxygen Delivery Method: Simple face mask Placement Confirmation: positive ETCO2   Spinal Patient location during procedure: OR Staffing Anesthesiologist: Montez Hageman Performed by: anesthesiologist  Preanesthetic Checklist Completed: patient identified, site marked, surgical consent, pre-op evaluation, timeout performed, IV checked, risks and benefits discussed and monitors and equipment checked Spinal Block Patient position: sitting Prep: Betadine Patient monitoring: heart rate, continuous pulse ox and blood pressure Approach: right paramedian Location: L2-3 Injection technique: single-shot Needle Needle type: Spinocan  Needle gauge: 22 G Needle length: 9 cm Additional Notes Expiration date of kit checked and confirmed. Patient tolerated procedure well, without complications.

## 2015-06-19 NOTE — Anesthesia Postprocedure Evaluation (Addendum)
  Anesthesia Post-op Note  Patient: Veronica Wells  Procedure(s) Performed: Procedure(s) (LRB): RIGHT TOTAL HIP ARTHROPLASTY ANTERIOR APPROACH (Right)  Patient Location: PACU  Anesthesia Type: spinal  Level of Consciousness: awake and alert   Airway and Oxygen Therapy: Patient Spontanous Breathing  Post-op Pain: mild  Post-op Assessment: Post-op Vital signs reviewed, Patient's Cardiovascular Status Stable, Respiratory Function Stable, Patent Airway and No signs of Nausea or vomiting  Last Vitals:  Filed Vitals:   06/19/15 1442  BP: 118/60  Pulse: 79  Temp: 36.7 C  Resp: 15    Post-op Vital Signs: stable   Complications: No apparent anesthesia complications

## 2015-06-19 NOTE — Progress Notes (Signed)
Portable AP Pelvis and Lateral Right Hip X-rays done. 

## 2015-06-19 NOTE — Progress Notes (Signed)
X-ray results noted 

## 2015-06-19 NOTE — Op Note (Signed)
NAME:  Veronica Wells NO.: [id number]      MEDICAL RECORD NO.: [id number]      FACILITY:  [facility]      PHYSICIAN:  Paralee Cancel D  DATE OF BIRTH:  1936/01/25     DATE OF PROCEDURE:  06/19/2015                                 OPERATIVE REPORT         PREOPERATIVE DIAGNOSIS: Right  hip osteoarthritis.      POSTOPERATIVE DIAGNOSIS:  Right hip osteoarthritis.      PROCEDURE:  Right total hip replacement through an anterior approach   utilizing DePuy THR system, component size 37mm pinnacle cup, a size 32+4 neutral   Altrex liner, a size 0 Hi Tri Lock stem with a 32+1 delta ceramic   ball.      SURGEON:  Pietro Cassis. Alvan Dame, M.D.      ASSISTANT: Nehemiah Massed, PA-C      ANESTHESIA:  Spinal.      SPECIMENS:  None.      COMPLICATIONS:  None.      BLOOD LOSS:  200 cc     DRAINS:  None.      INDICATION OF THE PROCEDURE:  SELAM PIETSCH [name] is a 79 y.o. female who had   presented to office for evaluation of right hip pain.  Radiographs revealed   progressive degenerative changes with bone-on-bone   articulation to the  hip joint.  The patient had painful limited range of   motion significantly affecting their overall quality of life.  The patient was failing to    respond to conservative measures, and at this point was ready   to proceed with more definitive measures.  The patient has noted progressive   degenerative changes in his hip, progressive problems and dysfunction   with regarding the hip prior to surgery.  Consent was obtained for   benefit of pain relief.  Specific risk of infection, DVT, component   failure, dislocation, need for revision surgery, as well discussion of   the anterior versus posterior approach were reviewed.  Consent was   obtained for benefit of anterior pain relief through an anterior   approach.      PROCEDURE IN DETAIL:  The patient was brought to operative theater.   Once adequate  anesthesia, preoperative antibiotics, 2gm of Ancef, 1 gm of Tranexamic Acid, and 10 mg of Decadron administered.   The patient was positioned supine on the OSI Hanna table.  Once adequate   padding of boney process was carried out, we had predraped out the hip, and  used fluoroscopy to confirm orientation of the pelvis and position.      The right hip was then prepped and draped from proximal iliac crest to   mid thigh with shower curtain technique.      Time-out was performed identifying the patient, planned procedure, and   extremity.     An incision was then made 2 cm distal and lateral to the   anterior superior iliac spine extending over the orientation of the   tensor fascia lata muscle and sharp dissection was carried down to the   fascia of the muscle and protractor placed in the soft tissues.  The fascia was then incised.  The muscle belly was identified and swept   laterally and retractor placed along the superior neck.  Following   cauterization of the circumflex vessels and removing some pericapsular   fat, a second cobra retractor was placed on the inferior neck.  A third   retractor was placed on the anterior acetabulum after elevating the   anterior rectus.  A L-capsulotomy was along the line of the   superior neck to the trochanteric fossa, then extended proximally and   distally.  Tag sutures were placed and the retractors were then placed   intracapsular.  We then identified the trochanteric fossa and   orientation of my neck cut, confirmed this radiographically   and then made a neck osteotomy with the femur on traction.  The femoral   head was removed without difficulty or complication.  Traction was let   off and retractors were placed posterior and anterior around the   acetabulum.      The labrum and foveal tissue were debrided.  I began reaming with a 67mm   reamer and reamed up to 19mm reamer with good bony bed preparation and a 45mm   cup was chosen.   The final 45mm Pinnacle cup was then impacted under fluoroscopy  to confirm the depth of penetration and orientation with respect to   abduction.  A screw was placed followed by the hole eliminator.  The final   32+4 neutral Altrex liner was impacted with good visualized rim fit.  The cup was positioned anatomically within the acetabular portion of the pelvis.      At this point, the femur was rolled at 80 degrees.  Further capsule was   released off the inferior aspect of the femoral neck.  I then   released the superior capsule proximally.  The hook was placed laterally   along the femur and elevated manually and held in position with the bed   hook.  The leg was then extended and adducted with the leg rolled to 100   degrees of external rotation.  Once the proximal femur was fully   exposed, I used a box osteotome to set orientation.  I then began   broaching with the starting chili pepper broach and passed this by hand and then broached up to 0 .  With the 0 broach in place I chose a high offset neck and did a trial reduction.  The offset was appropriate, leg lengths   appeared to be equal, confirmed radiographically.   Given these findings, I went ahead and dislocated the hip, repositioned all   retractors and positioned the right hip in the extended and abducted position.  The final 0 Hi Tri Lock stem was   chosen and it was impacted down to the level of neck cut.  Based on this   and the trial reduction, a 32+1 delta ceramic ball was chosen and   impacted onto a clean and dry trunnion, and the hip was reduced.  The   hip had been irrigated throughout the case again at this point.  I did   reapproximate the superior capsular leaflet to the anterior leaflet   using #1 Vicryl.  The fascia of the   tensor fascia lata muscle was then reapproximated using #1 Vicryl and #0 V-lock sutures.  The   remaining wound was closed with 2-0 Vicryl and running 4-0 Monocryl.   The hip was cleaned, dried,  and dressed sterilely using  Dermabond and   Aquacel dressing.  She was then brought   to recovery room in stable condition tolerating the procedure well.    Nehemiah Massed, PA-C was present for the entirety of the case involved from   preoperative positioning, perioperative retractor management, general   facilitation of the case, as well as primary wound closure as assistant.            Pietro Cassis Alvan Dame, M.D.        06/19/2015 10:01 AM

## 2015-06-19 NOTE — Evaluation (Signed)
Physical Therapy Evaluation Patient Details Name: Veronica Wells [name] MRN: [id number] DOB: 1935-11-16 Today's Date: 06/19/2015   History of Present Illness  R DATHA  Clinical Impression  Patient had IV pain meds prior, felt dizzy. BP 104/59. Patient will benefit from PT to address problems listed in note below.    Follow Up Recommendations Home health PT;Supervision/Assistance - 24 hour    Equipment Recommendations  Rolling walker with 5" wheels    Recommendations for Other Services       Precautions / Restrictions Precautions Precautions: Fall Precaution Comments: dizzy      Mobility  Bed Mobility Overal bed mobility: Needs Assistance Bed Mobility: Supine to Sit     Supine to sit: Min assist     General bed mobility comments: cues for technique  Transfers Overall transfer level: Needs assistance Equipment used: Rolling walker (2 wheeled) Transfers: Sit to/from Stand Sit to Stand: Min assist         General transfer comment: cues for hand placement and R leg  Ambulation/Gait Ambulation/Gait assistance: Min assist Ambulation Distance (Feet): 20 Feet Assistive device: Rolling walker (2 wheeled) Gait Pattern/deviations: Step-to pattern;Antalgic     General Gait Details: cues for sequence  Stairs            Wheelchair Mobility    Modified Rankin (Stroke Patients Only)       Balance                                             Pertinent Vitals/Pain Pain Assessment: 0-10 Pain Score: 5  Pain Location: R thigh Pain Descriptors / Indicators: Aching Pain Intervention(s): Limited activity within patient's tolerance;Monitored during session;Premedicated before session;Repositioned;Ice applied    Home Living Family/patient expects to be discharged to:: Private residence Living Arrangements: Spouse/significant other Available Help at Discharge: Family Type of Home: House Home Access: Stairs to enter Entrance Stairs-Rails:  None Entrance Stairs-Number of Steps: 3 Home Layout: Two level;Able to live on main level with bedroom/bathroom Home Equipment: None      Prior Function Level of Independence: Independent               Hand Dominance        Extremity/Trunk Assessment               Lower Extremity Assessment: RLE deficits/detail RLE Deficits / Details: able to bear weight and advance    Cervical / Trunk Assessment: Kyphotic  Communication   Communication: No difficulties  Cognition Arousal/Alertness: Awake/alert Behavior During Therapy: WFL for tasks assessed/performed Overall Cognitive Status: Within Functional Limits for tasks assessed                      General Comments      Exercises Total Joint Exercises Ankle Circles/Pumps: AROM;Both;5 reps Quad Sets: AROM;Both;5 reps Gluteal Sets: AROM;Both;5 reps      Assessment/Plan    PT Assessment Patient needs continued PT services  PT Diagnosis Difficulty walking;Acute pain   PT Problem List Decreased strength;Decreased range of motion;Decreased activity tolerance;Decreased mobility;Decreased knowledge of precautions;Decreased safety awareness;Decreased knowledge of use of DME;Pain  PT Treatment Interventions DME instruction;Gait training;Stair training;Functional mobility training;Therapeutic activities;Therapeutic exercise;Patient/family education   PT Goals (Current goals can be found in the Care Plan section) Acute Rehab PT Goals Patient Stated Goal: to ride my bike PT Goal Formulation: With patient/family Time For  Goal Achievement: 06/22/15 Potential to Achieve Goals: Good    Frequency 7X/week   Barriers to discharge        Co-evaluation               End of Session   Activity Tolerance: Patient limited by fatigue (dizziness) Patient left: in chair;with call bell/phone within reach;with family/visitor present Nurse Communication: Mobility status         Time: 1657-9038 PT Time  Calculation (min) (ACUTE ONLY): 12 min   Charges:   PT Evaluation $Initial PT Evaluation Tier I: 1 Procedure     PT G CodesClaretha Cooper 06/19/2015, 5:47 PM Tresa Endo PT [phone]

## 2015-06-20 LAB — CBC
HCT: 30.7 % — ABNORMAL LOW (ref 36.0–46.0)
Hemoglobin: 10 g/dL — ABNORMAL LOW (ref 12.0–15.0)
MCH: 30.7 pg (ref 26.0–34.0)
MCHC: 32.6 g/dL (ref 30.0–36.0)
MCV: 94.2 fL (ref 78.0–100.0)
PLATELETS: 195 10*3/uL (ref 150–400)
RBC: 3.26 MIL/uL — ABNORMAL LOW (ref 3.87–5.11)
RDW: 14.7 % (ref 11.5–15.5)
WBC: 6.2 10*3/uL (ref 4.0–10.5)

## 2015-06-20 LAB — BASIC METABOLIC PANEL
Anion gap: 4 — ABNORMAL LOW (ref 5–15)
BUN: 21 mg/dL — AB (ref 6–20)
CALCIUM: 8.3 mg/dL — AB (ref 8.9–10.3)
CO2: 27 mmol/L (ref 22–32)
Chloride: 106 mmol/L (ref 101–111)
Creatinine, Ser: 0.69 mg/dL (ref 0.44–1.00)
GFR calc Af Amer: >60 mL/min (ref >60)
GLUCOSE: 97 mg/dL (ref 65–99)
Potassium: 4.2 mmol/L (ref 3.5–5.1)
Sodium: 137 mmol/L (ref 135–145)

## 2015-06-20 MED ORDER — TIZANIDINE HCL 4 MG PO TABS: 4.0000 mg | ORAL_TABLET | Freq: Four times a day (QID) | ORAL | Status: DC | PRN

## 2015-06-20 MED ORDER — ASPIRIN 325 MG PO TBEC: 325.0000 mg | DELAYED_RELEASE_TABLET | Freq: Two times a day (BID) | ORAL | Status: AC

## 2015-06-20 MED ORDER — HYDROCODONE-ACETAMINOPHEN 7.5-325 MG PO TABS: 1.0000 | ORAL_TABLET | ORAL | Status: DC | PRN

## 2015-06-20 MED ORDER — POLYETHYLENE GLYCOL 3350 17 G PO PACK: 17.0000 g | PACK | Freq: Two times a day (BID) | ORAL | Status: DC

## 2015-06-20 MED ORDER — DOCUSATE SODIUM 100 MG PO CAPS: 100.0000 mg | ORAL_CAPSULE | Freq: Two times a day (BID) | ORAL | Status: DC

## 2015-06-20 MED ORDER — FERROUS SULFATE 325 (65 FE) MG PO TABS: 325.0000 mg | ORAL_TABLET | Freq: Three times a day (TID) | ORAL | Status: DC

## 2015-06-20 NOTE — Discharge Instructions (Signed)

## 2015-06-20 NOTE — Progress Notes (Signed)
     Subjective: 1 Day Post-Op Procedure(s) (LRB): RIGHT TOTAL HIP ARTHROPLASTY ANTERIOR APPROACH (Right)   Patient reports pain as mild, pain controlled. No events throughout the night.  Already studying the home exercises. Ready to be discharged home.  Objective:   VITALS:   Filed Vitals:   06/20/15 0509  BP: 100/53  Pulse: 63  Temp: 97.4 F (36.3 C)  Resp: 16    Dorsiflexion/Plantar flexion intact Incision: dressing C/D/I No cellulitis present Compartment soft  LABS  Recent Labs  06/20/15 0507  HGB 10.0*  HCT 30.7*  WBC 6.2  PLT 195     Recent Labs  06/20/15 0507  NA 137  K 4.2  BUN 21*  CREATININE 0.69  GLUCOSE 97     Assessment/Plan: 1 Day Post-Op Procedure(s) (LRB): RIGHT TOTAL HIP ARTHROPLASTY ANTERIOR APPROACH (Right) Foley cath d/c'ed Advance diet Up with therapy D/C IV fluids Discharge home with home health Follow up in 2 weeks at [facility] Follow up with OLIN,Maksym Pfiffner D in 2 weeks.  Contact information:  [facility] [address], [address] [place] Nicole Defino   PAC  06/20/2015, 8:22 AM

## 2015-06-20 NOTE — Care Management Note (Signed)
Case Management Note  Patient Details  Name: Veronica Wells Der Veronica Wells MRN: [id number] Date of Birth: 03/07/1936  Subjective/Objective:         Right total hip replacement            Action/Plan: Discharge planning, spoke with patient and husband. Plans for d/c home today after working with PT. Has chosen [place] for [facility] services. Declines 3-n-1 and has own RW.  Expected Discharge Date:                  Expected Discharge Plan:  [place]  In-House Referral:  NA  Discharge planning Services  CM Consult  Post Acute Care Choice:  Home Health Choice offered to:  Patient  DME Arranged:  3-N-1, Walker rolling DME Agency:  [place]:  PT [place]:  [place]  Status of Service:  Completed, signed off  Medicare Important Message Given:    Date Medicare IM Given:    Medicare IM give by:    Date Additional Medicare IM Given:    Additional Medicare Important Message give by:     If discussed at [place] of Stay Meetings, dates discussed:    Additional Comments:  Guadalupe Maple, RN 06/20/2015, 9:51 AM

## 2015-06-20 NOTE — Evaluation (Signed)
Occupational Therapy Evaluation Patient Details Name: Veronica Wells MRN: [id number] DOB: 06/26/1936 Today's Date: 06/20/2015    History of Present Illness R DATHA   Clinical Impression   Patient presenting with decreased ADL and functional mobility independence secondary to above. Patient required some assistance with LB ADLs and was independent with mobility PTA. Patient currently requires min assist with LB ADLs and up to min assist with functional mobility using RW. Pt with decreased safety awareness when using RW, trying to push RW out of the way. Pt asked therapist if she could try walking without RW. Therapist encouraged patient to use RW until physical therapy advised her otherwise. Therapist explained to patient that she will be receiving HHPT and will need 24/7 supervision for first few days-> week post acute d/c. Patient will benefit from acute OT to increase overall independence in the areas of ADLs, functional mobility using RW, and overall safety in order to safely discharge home with husband.     Follow Up Recommendations  No OT follow up;Supervision/Assistance - 24 hour    Equipment Recommendations  3 in 1 bedside comode    Recommendations for Other Services  None at this time   Precautions / Restrictions Precautions Precautions: Fall Restrictions Weight Bearing Restrictions: No    Mobility Bed Mobility Overal bed mobility: Needs Assistance Bed Mobility: Supine to Sit     Supine to sit: Min assist     General bed mobility comments: Assistance for management of RLE, cues for technique/sequencing.   Transfers Overall transfer level: Needs assistance Equipment used: Rolling walker (2 wheeled) Transfers: Sit to/from Stand Sit to Stand: Min guard         General transfer comment: Min guard for safety. Cues for technique, sequencing, hand placement.     Balance Overall balance assessment: Needs assistance Sitting-balance support: No upper extremity  supported;Feet supported Sitting balance-Leahy Scale: Good     Standing balance support: Bilateral upper extremity supported;During functional activity Standing balance-Leahy Scale: Fair    ADL Overall ADL's : Needs assistance/impaired General ADL Comments: Pt unable to reach RLE for LB ADLs, pt reports this was difficult for her PTA. Pt refused education or use of AE to assist with this, stating her husband could assist post acute d/c. Pt engaged in bed mobility with min assist for management of RLE. Pt then ambulated > BR for toilet transfer. Encouraged use of 3-in-1 over toilet (at low setting secondary to patient's height) and 3-in1 in walk-in shower. Pts sats on RA =90% or greater during and post activity.     Pertinent Vitals/Pain Pain Assessment: 0-10 Pain Score: 6  Pain Location: RLE  Pain Descriptors / Indicators: Aching;Sore Pain Intervention(s): Monitored during session;Repositioned;RN gave pain meds during session     Hand Dominance Right   Extremity/Trunk Assessment Upper Extremity Assessment Upper Extremity Assessment: Overall WFL for tasks assessed   Lower Extremity Assessment Lower Extremity Assessment: Defer to PT evaluation   Cervical / Trunk Assessment Cervical / Trunk Assessment: Kyphotic   Communication Communication Communication: No difficulties   Cognition Arousal/Alertness: Awake/alert Behavior During Therapy: WFL for tasks assessed/performed Overall Cognitive Status: Within Functional Limits for tasks assessed              Home Living Family/patient expects to be discharged to:: Private residence Living Arrangements: Spouse/significant other Available Help at Discharge: Family Type of Home: House Home Access: Stairs to enter Technical brewer of Steps: 3 Entrance Stairs-Rails: None Home Layout: Two level;Able to live on main  level with bedroom/bathroom     Bathroom Shower/Tub: Walk-in shower;Door   [employer] Toilet: Standard      Home Equipment: Shower seat - built in   Prior Functioning/Environment Level of Independence: Independent     OT Diagnosis: Generalized weakness;Acute pain   OT Problem List: Decreased strength;Decreased activity tolerance;Impaired balance (sitting and/or standing);Decreased safety awareness;Decreased knowledge of use of DME or AE;Decreased knowledge of precautions;Pain   OT Treatment/Interventions: Self-care/ADL training;Therapeutic exercise;Energy conservation;DME and/or AE instruction;Therapeutic activities;Patient/family education;Balance training    OT Goals(Current goals can be found in the care plan section) Acute Rehab OT Goals Patient Stated Goal: go home soon OT Goal Formulation: With patient Time For Goal Achievement: 07/04/15 Potential to Achieve Goals: Good ADL Goals Pt Will Perform Lower Body Bathing: with modified independence;sit to/from stand (AE prn) Pt Will Perform Lower Body Dressing: with modified independence;sit to/from stand (AE prn) Pt Will Transfer to Toilet: with modified independence;ambulating;bedside commode Pt Will Perform Tub/Shower Transfer: Shower transfer;3 in 1;rolling walker;ambulating;with modified independence Additional ADL Goal #1: Pt will be mod I using RW for safe mobility/ambulation  OT Frequency: Min 2X/week   Barriers to D/C: None known at this time   End of Session Equipment Utilized During Treatment: Gait belt;Rolling walker Nurse Communication: Mobility status  Activity Tolerance: Patient tolerated treatment well Patient left: in chair;with call bell/phone within reach;with nursing/sitter in room   Time: 0828-0849 OT Time Calculation (min): 21 min Charges:  OT General Charges $OT Visit: 1 Procedure OT Evaluation $Initial OT Evaluation Tier I: 1 Procedure  Marvie Calender , MS, OTR/L, CLT Pager: [phone]  06/20/2015, 8:57 AM

## 2015-06-21 ENCOUNTER — Encounter (HOSPITAL_COMMUNITY): Payer: Self-pay | Admitting: Orthopedic Surgery

## 2015-06-25 NOTE — Discharge Summary (Signed)
Physician Discharge Summary  Patient ID: Veronica Wells MRN: [id number] DOB/AGE: 77-22-1937 77 y.o.  Admit date: 06/19/2015 Discharge date: 06/20/2015   Procedures:  Procedure(s) (LRB): RIGHT TOTAL HIP ARTHROPLASTY ANTERIOR APPROACH (Right)  Attending Physician:  Dr. Paralee Cancel   Admission Diagnoses:   Right hip primary OA / pain  Discharge Diagnoses:  Principal Problem:   S/P right THA, AA  Past Medical History  Diagnosis Date  . Osteopenia   . Cancer 2003    melanoma on back right arm  . Headaches, cluster   . Hemorrhoids   . Arthritis   . Environmental allergies   . Difficulty sleeping     HPI:    Veronica Wells, 79 y.o. female, has a history of pain and functional disability in the right hip(s) due to arthritis and patient has failed non-surgical conservative treatments for greater than 12 weeks to include NSAID's and/or analgesics, corticosteriod injections and activity modification. Onset of symptoms was gradual starting 1+ years ago with gradually worsening course since that time.The patient noted no past surgery on the right hip(s). Patient currently rates pain in the right hip at 8 out of 10 with activity. Patient has night pain, worsening of pain with activity and weight bearing, trendelenberg gait, pain that interfers with activities of daily living and pain with passive range of motion. Patient has evidence of periarticular osteophytes and joint space narrowing by imaging studies. This condition presents safety issues increasing the risk of falls. There is no current active infection. Risks, benefits and expectations were discussed with the patient. Risks including but not limited to the risk of anesthesia, blood clots, nerve damage, blood vessel damage, failure of the prosthesis, infection and up to and including death. Patient understand the risks, benefits and expectations and wishes to proceed with surgery.   PCP: PROVIDER NOT IN SYSTEM    Discharged Condition: good  Hospital Course:  Patient underwent the above stated procedure on 06/19/2015. Patient tolerated the procedure well and brought to the recovery room in good condition and subsequently to the floor.  POD #1 BP: 100/53 ; Pulse: 63 ; Temp: 97.4 F (36.3 C) ; Resp: 16 Patient reports pain as mild, pain controlled. No events throughout the night. Already studying the home exercises. Ready to be discharged home. Dorsiflexion/plantar flexion intact, incision: dressing C/D/I, no cellulitis present and compartment soft.   LABS  Basename    HGB  10.0  HCT  30.7    Discharge Exam: General appearance: alert, cooperative and no distress Extremities: Homans sign is negative, no sign of DVT, no edema, redness or tenderness in the calves or thighs and no ulcers, gangrene or trophic changes  Disposition: Home with follow up in 2 weeks   Follow-up Information    Follow up with Mauri Pole, MD. Schedule an appointment as soon as possible for a visit in 2 weeks.   Specialty:  Orthopedic Surgery   Contact information:   [address] [place] [address] [phone]       Follow up with [facility].   Why:  physical therapy   Contact information:   [address] [place] [address] [phone]       Discharge Instructions    Call MD / Call 911    Complete by:  As directed   If you experience chest pain or shortness of breath, CALL 911 and be transported to the hospital emergency room.  If you  develope a fever above 101 F, pus (white drainage) or increased drainage or redness at the wound, or calf pain, call your surgeon's office.     Change dressing    Complete by:  As directed   Maintain surgical dressing until follow up in the clinic. If the edges start to pull up, may reinforce with tape. If the dressing is no longer working, may remove and cover with gauze and tape, but must keep the area dry and clean.  Call with  any questions or concerns.     Constipation Prevention    Complete by:  As directed   Drink plenty of fluids.  Prune juice may be helpful.  You may use a stool softener, such as Colace (over the counter) 100 mg twice a day.  Use MiraLax (over the counter) for constipation as needed.     Diet - low sodium heart healthy    Complete by:  As directed      Discharge instructions    Complete by:  As directed   Maintain surgical dressing until follow up in the clinic. If the edges start to pull up, may reinforce with tape. If the dressing is no longer working, may remove and cover with gauze and tape, but must keep the area dry and clean.  Follow up in 2 weeks at [facility]. Call with any questions or concerns.     Increase activity slowly as tolerated    Complete by:  As directed   Weight bearing as tolerated with assist device (walker, cane, etc) as directed, use it as long as suggested by your surgeon or therapist, typically at least 4-6 weeks.     TED hose    Complete by:  As directed   Use stockings (TED hose) for 2 weeks on both leg(s).  You may remove them at night for sleeping.             Medication List    STOP taking these medications        aspirin 81 MG tablet  Replaced by:  aspirin 325 MG EC tablet     naproxen sodium 220 MG tablet  Commonly known as:  ANAPROX      TAKE these medications        aspirin 325 MG EC tablet  Take 1 tablet (325 mg total) by mouth 2 (two) times daily.     CALCIUM + D PO  Take 600 mg by mouth 2 (two) times daily. Take 2 pills daily     DEPAKOTE ER 500 MG 24 hr tablet  Generic drug:  divalproex  Take 500 mg by mouth daily as needed (cluster headaches.).     docusate sodium 100 MG capsule  Commonly known as:  COLACE  Take 1 capsule (100 mg total) by mouth 2 (two) times daily.     ferrous sulfate 325 (65 FE) MG tablet  Take 1 tablet (325 mg total) by mouth 3 (three) times daily after meals.     HYDROcodone-acetaminophen  7.5-325 MG per tablet  Commonly known as:  NORCO  Take 1-2 tablets by mouth every 4 (four) hours as needed for moderate pain.     polyethylene glycol packet  Commonly known as:  MIRALAX / GLYCOLAX  Take 17 g by mouth 2 (two) times daily.     tiZANidine 4 MG tablet  Commonly known as:  ZANAFLEX  Take 1 tablet (4 mg total) by mouth every 6 (six) hours as needed for muscle spasms.  Signed: West Pugh. Camyah Pultz   PA-C  06/25/2015, 10:18 AM

## 2015-06-26 NOTE — Progress Notes (Signed)
Late entry for PT service for 06/20/15  Physical Therapy Treatment Patient Details Name: Veronica Wells [name] MRN: [id number] DOB: 1936-03-21 Today's Date: 06/26/2015    History of Present Illness R DATHA    PT Comments    Assisted with gait training and TE's HEP for D/C to home today  Follow Up Recommendations  Home health PT;Supervision/Assistance - 24 hour     Equipment Recommendations  Rolling walker with 5" wheels    Recommendations for Other Services       Precautions / Restrictions Precautions Precautions: Fall Restrictions Weight Bearing Restrictions: No    Mobility  Bed Mobility               General bed mobility comments: Pt OOB in recliner  Transfers Overall transfer level: Needs assistance Equipment used: Rolling walker (2 wheeled) Transfers: Sit to/from Stand Sit to Stand: Supervision;Min guard         General transfer comment: Min guard for safety. Cues for technique, sequencing, hand placement.   Ambulation/Gait Ambulation/Gait assistance: Supervision;Min guard Ambulation Distance (Feet): 5 Feet Assistive device: Rolling walker (2 wheeled)   Gait velocity: decreased   General Gait Details: cues for sequence and safety with turns   Stairs            Wheelchair Mobility    Modified Rankin (Stroke Patients Only)       Balance                                    Cognition                            Exercises   Total Hip Replacement TE's 10 reps ankle pumps 10 reps knee presses 10 reps heel slides 10 reps SAQ's 10 reps ABD Followed by [facility]     General Comments        Pertinent Vitals/Pain      Home Living                      Prior Function            PT Goals (current goals can now be found in the care plan section) Progress towards PT goals: Progressing toward goals    Frequency  7X/week    PT Plan      Co-evaluation             End of Session  Equipment Utilized During Treatment: Gait belt Activity Tolerance: Patient tolerated treatment well Patient left: in chair;with call bell/phone within reach;with family/visitor present     Time: 1040-1110 PT Time Calculation (min) (ACUTE ONLY): 30 min  Charges:    1 gt    1 te                   G Codes:      Rica Koyanagi  PTA [facility]  Acute  Rehab Pager      [phone]

## 2015-07-04 DIAGNOSIS — Z471 Aftercare following joint replacement surgery: Secondary | ICD-10-CM | POA: Diagnosis not present

## 2015-07-04 DIAGNOSIS — Z96641 Presence of right artificial hip joint: Secondary | ICD-10-CM | POA: Diagnosis not present

## 2015-07-07 DIAGNOSIS — Z23 Encounter for immunization: Secondary | ICD-10-CM | POA: Diagnosis not present

## 2015-07-25 DIAGNOSIS — Z96641 Presence of right artificial hip joint: Secondary | ICD-10-CM | POA: Diagnosis not present

## 2015-07-25 DIAGNOSIS — Z471 Aftercare following joint replacement surgery: Secondary | ICD-10-CM | POA: Diagnosis not present

## 2015-11-13 DIAGNOSIS — X32XXXA Exposure to sunlight, initial encounter: Secondary | ICD-10-CM | POA: Diagnosis not present

## 2015-11-13 DIAGNOSIS — L570 Actinic keratosis: Secondary | ICD-10-CM | POA: Diagnosis not present

## 2015-11-13 DIAGNOSIS — L820 Inflamed seborrheic keratosis: Secondary | ICD-10-CM | POA: Diagnosis not present

## 2015-11-13 DIAGNOSIS — D1801 Hemangioma of skin and subcutaneous tissue: Secondary | ICD-10-CM | POA: Diagnosis not present

## 2015-11-13 DIAGNOSIS — Z08 Encounter for follow-up examination after completed treatment for malignant neoplasm: Secondary | ICD-10-CM | POA: Diagnosis not present

## 2015-11-13 DIAGNOSIS — L821 Other seborrheic keratosis: Secondary | ICD-10-CM | POA: Diagnosis not present

## 2015-11-13 DIAGNOSIS — R238 Other skin changes: Secondary | ICD-10-CM | POA: Diagnosis not present

## 2015-11-13 DIAGNOSIS — L814 Other melanin hyperpigmentation: Secondary | ICD-10-CM | POA: Diagnosis not present

## 2016-01-11 DIAGNOSIS — H2513 Age-related nuclear cataract, bilateral: Secondary | ICD-10-CM | POA: Diagnosis not present

## 2016-01-15 DIAGNOSIS — G44009 Cluster headache syndrome, unspecified, not intractable: Secondary | ICD-10-CM | POA: Diagnosis not present

## 2016-03-01 IMAGING — RF DG FLUORO GUIDE NDL PLC/BX
1 series · 1 of 1 positions shown · non-contrast
Comparison: none

CLINICAL DATA: Severe right hip pain, keeps her up at night.
Arthritis on radiography. Plays tennis.

EXAM:
RIGHT HIP INJECTION UNDER FLUOROSCOPY
FLUOROSCOPY TIME:  05seconds, 32.3 uQymK DAP
TECHNIQUE: The procedure, risks (including but not limited to bleeding,
infection, organ damage ), benefits, and alternatives were explained
to the patient. Questions regarding the procedure were encouraged
and answered. The patient understands and consents to the procedure.
An appropriate skin entry site was determined under fluoroscopy.
Site was marked, prepped with Betadine, draped in usual sterile
fashion, infiltrated locally with 1% lidocaine. A 22 gauge spinal
needle was advanced to the superior lateral margin of the femoral
head. 1 ml lidocaine 1% injected easily. Contrast injection of 2 ml
Pmnipaque-M88 showed intraarticular spread without any intravascular
component. 120 mg Depo-Medrol and 5 ml lidocaine 1% was
administered. The patient tolerated procedure well.
COMPLICATIONS:
None immediate

[Series 1: dg fluoro guide ndl plc/bx/inj/loc · 1 of 1 slices shown]
[im 1/1]
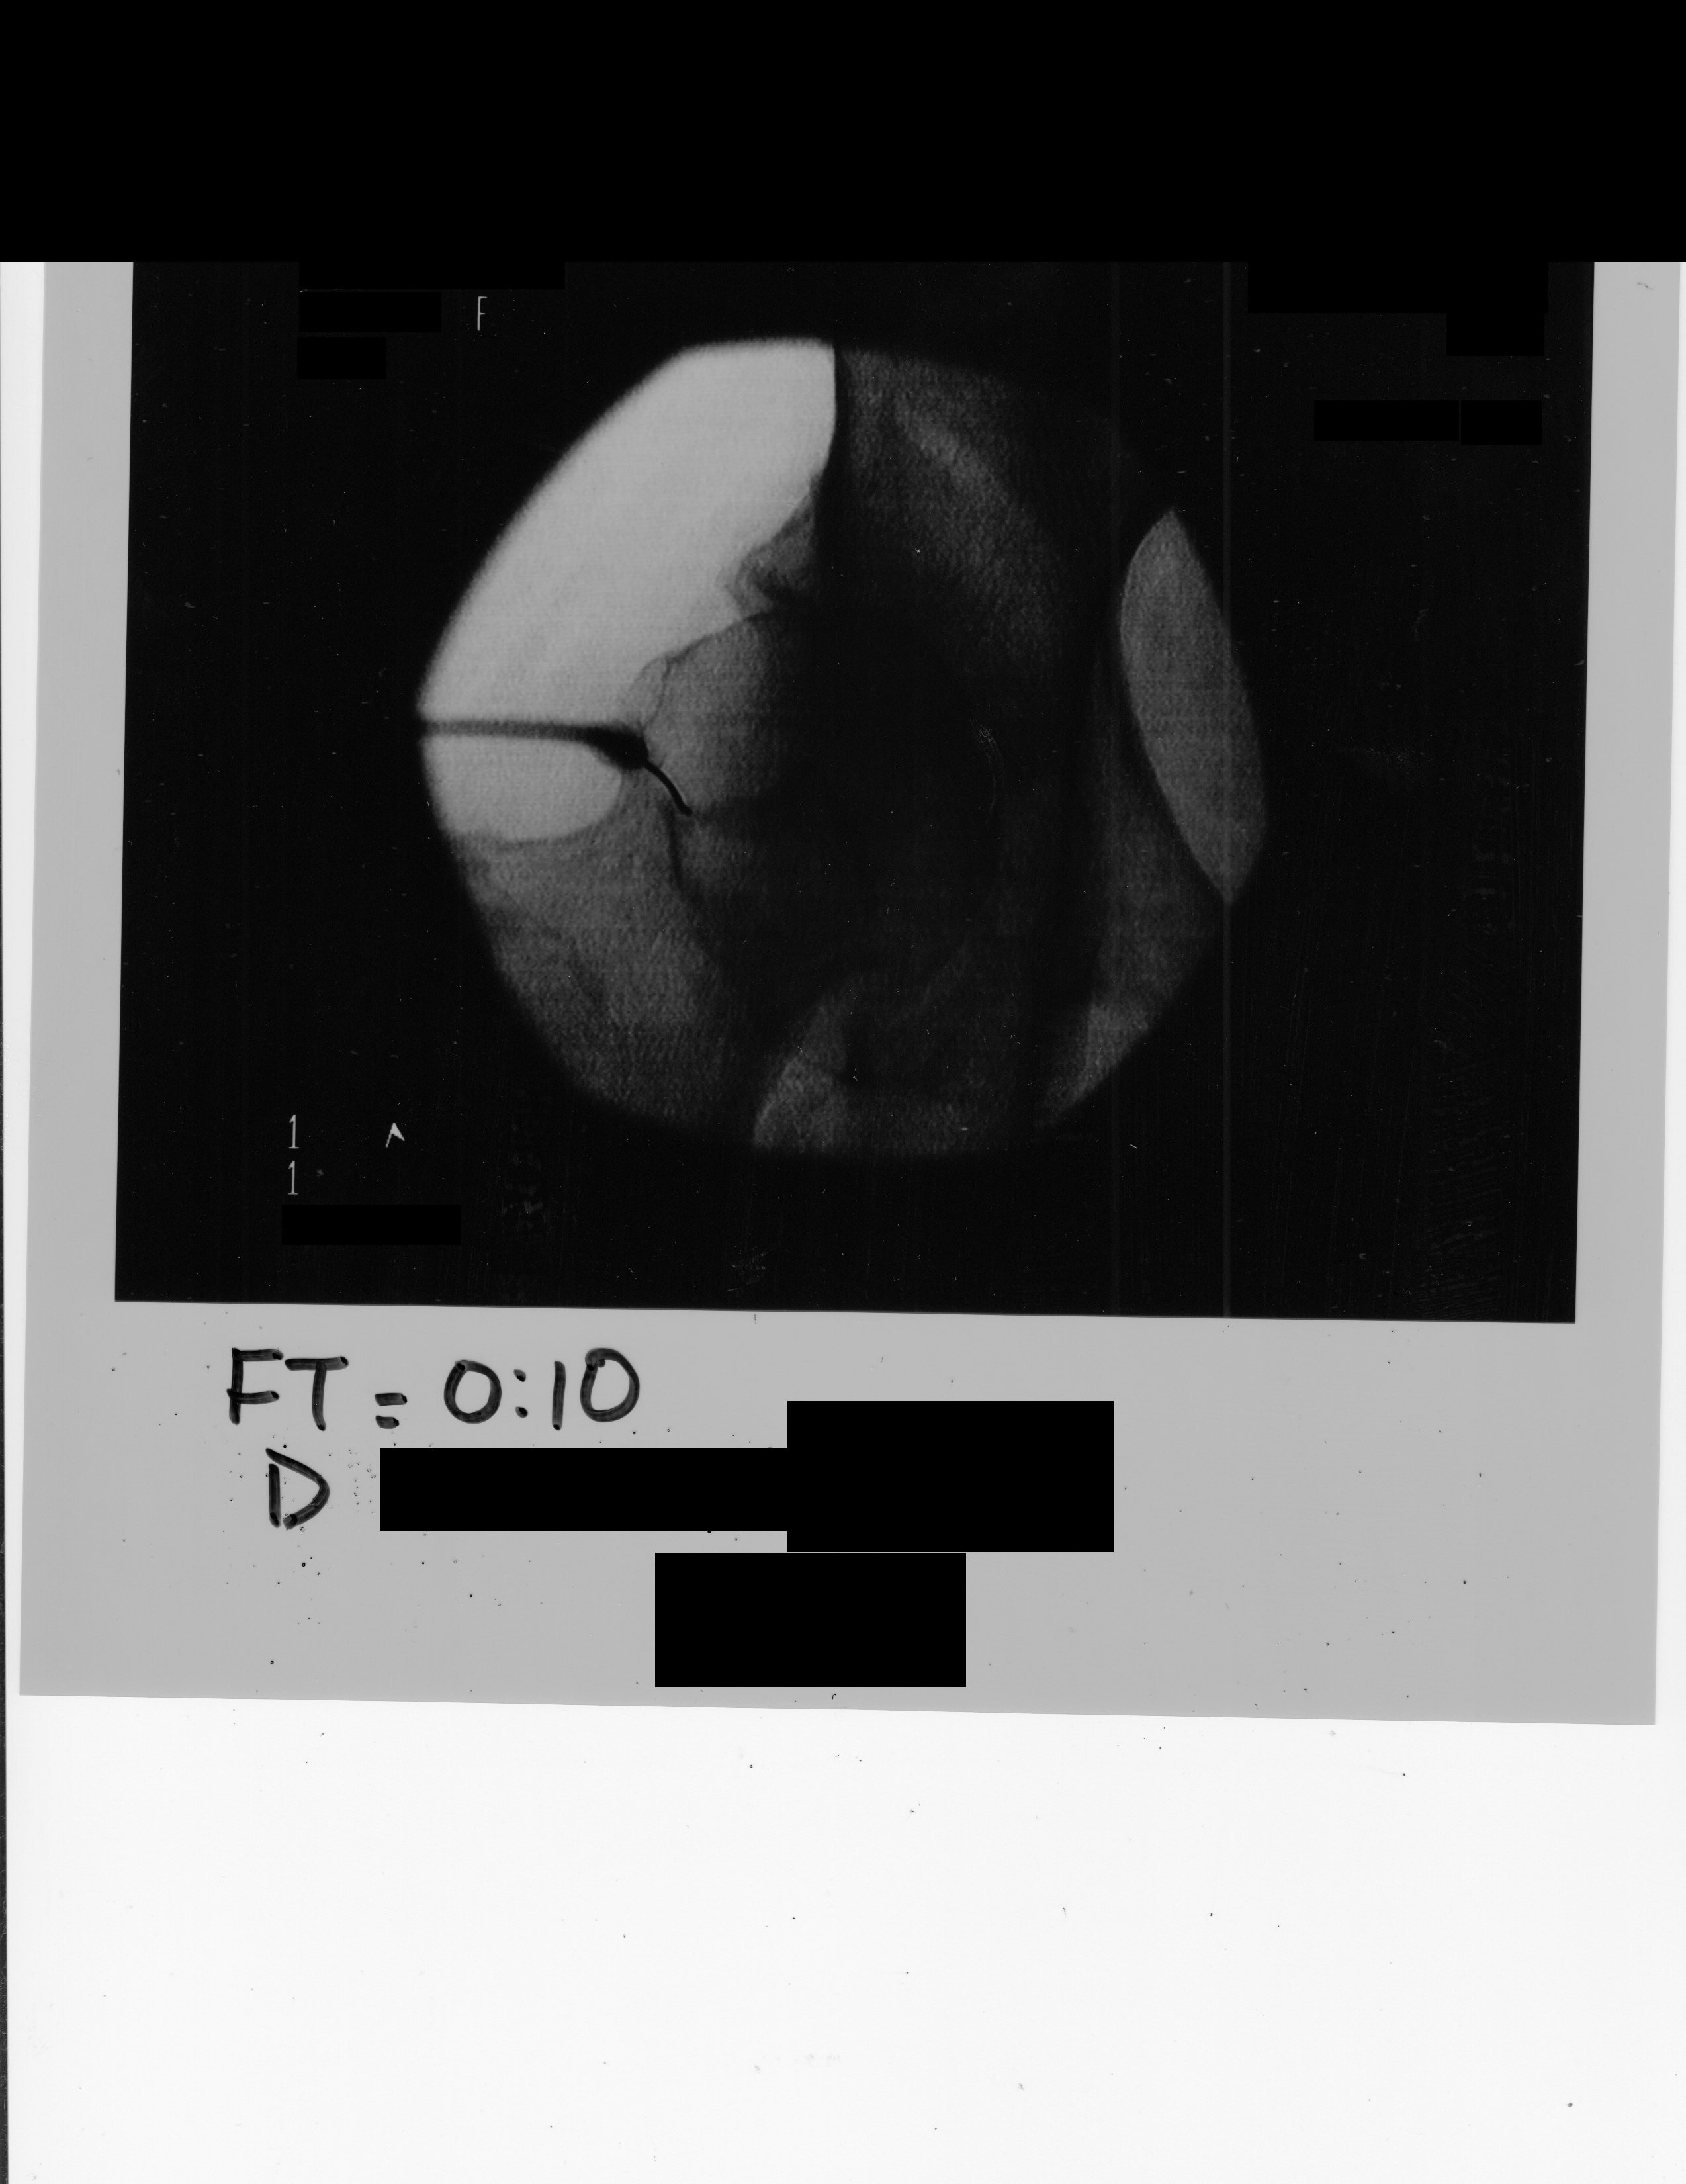

[1 of 1 positions shown; findings below may reference images not displayed]

IMPRESSION: 1. Technically successful right hip injection under fluoroscopy

## 2016-04-20 IMAGING — DX DG HIP (WITH OR WITHOUT PELVIS) 1V PORT*R*
2 series · 2 of 2 positions shown · non-contrast
Comparison: None.

CLINICAL DATA: Status post right hip replacement today.

EXAM:
DG HIP (WITH OR WITHOUT PELVIS) 1V PORT RIGHT

[pelvis ap]
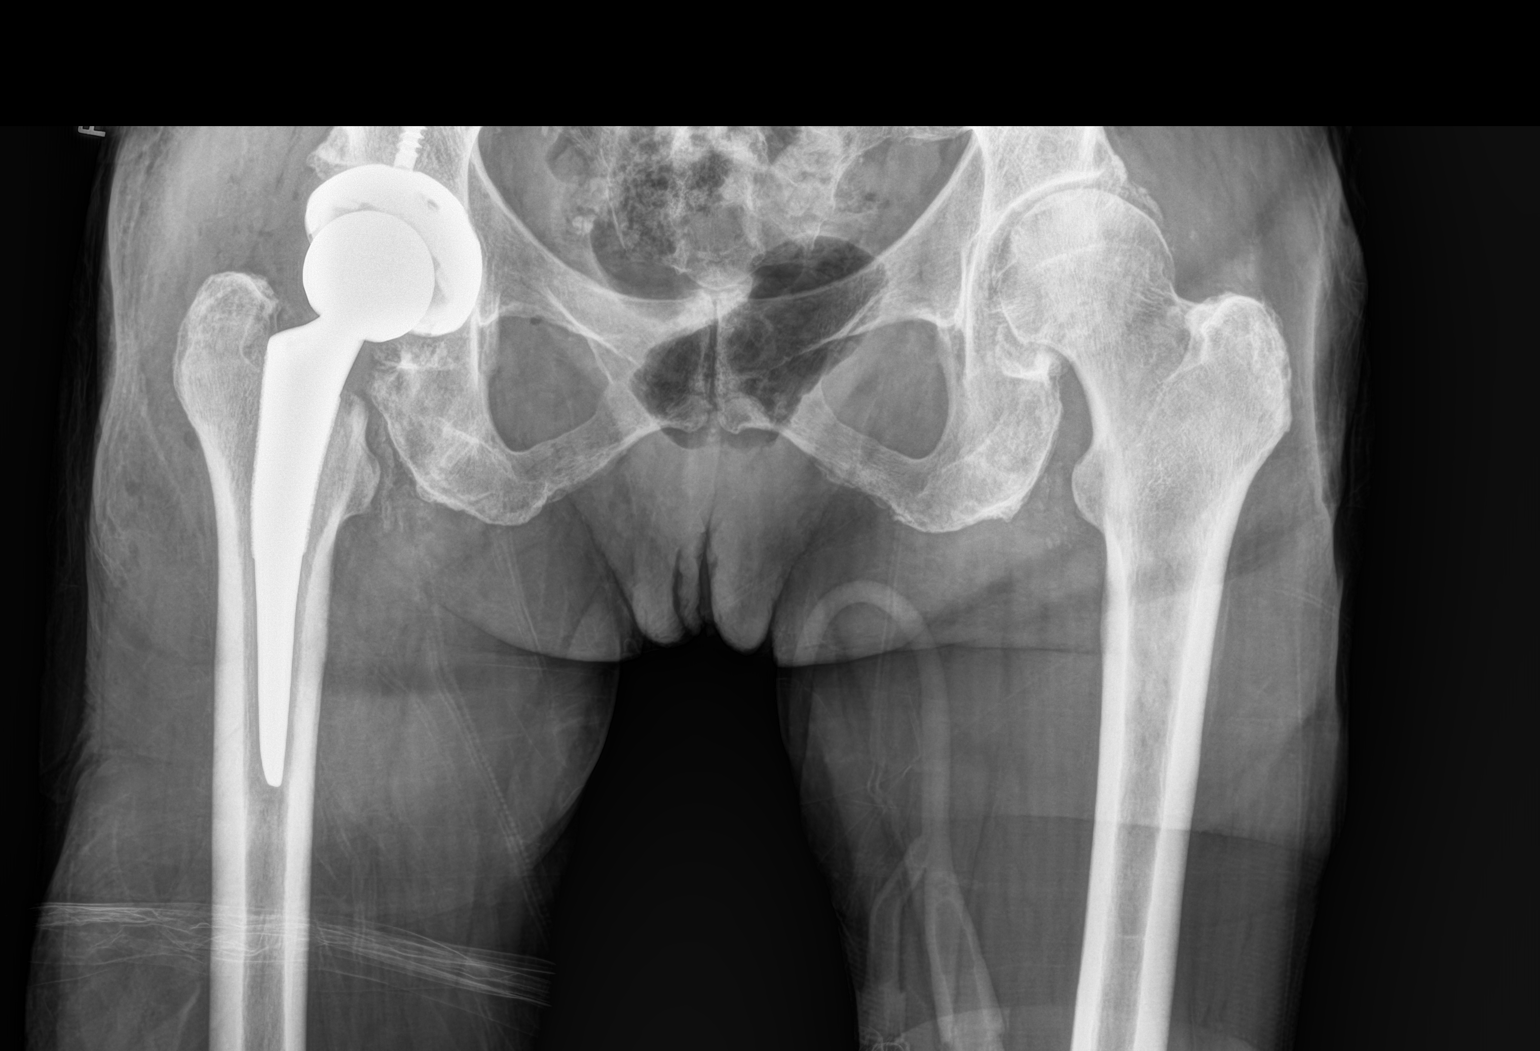

[hip x-table]
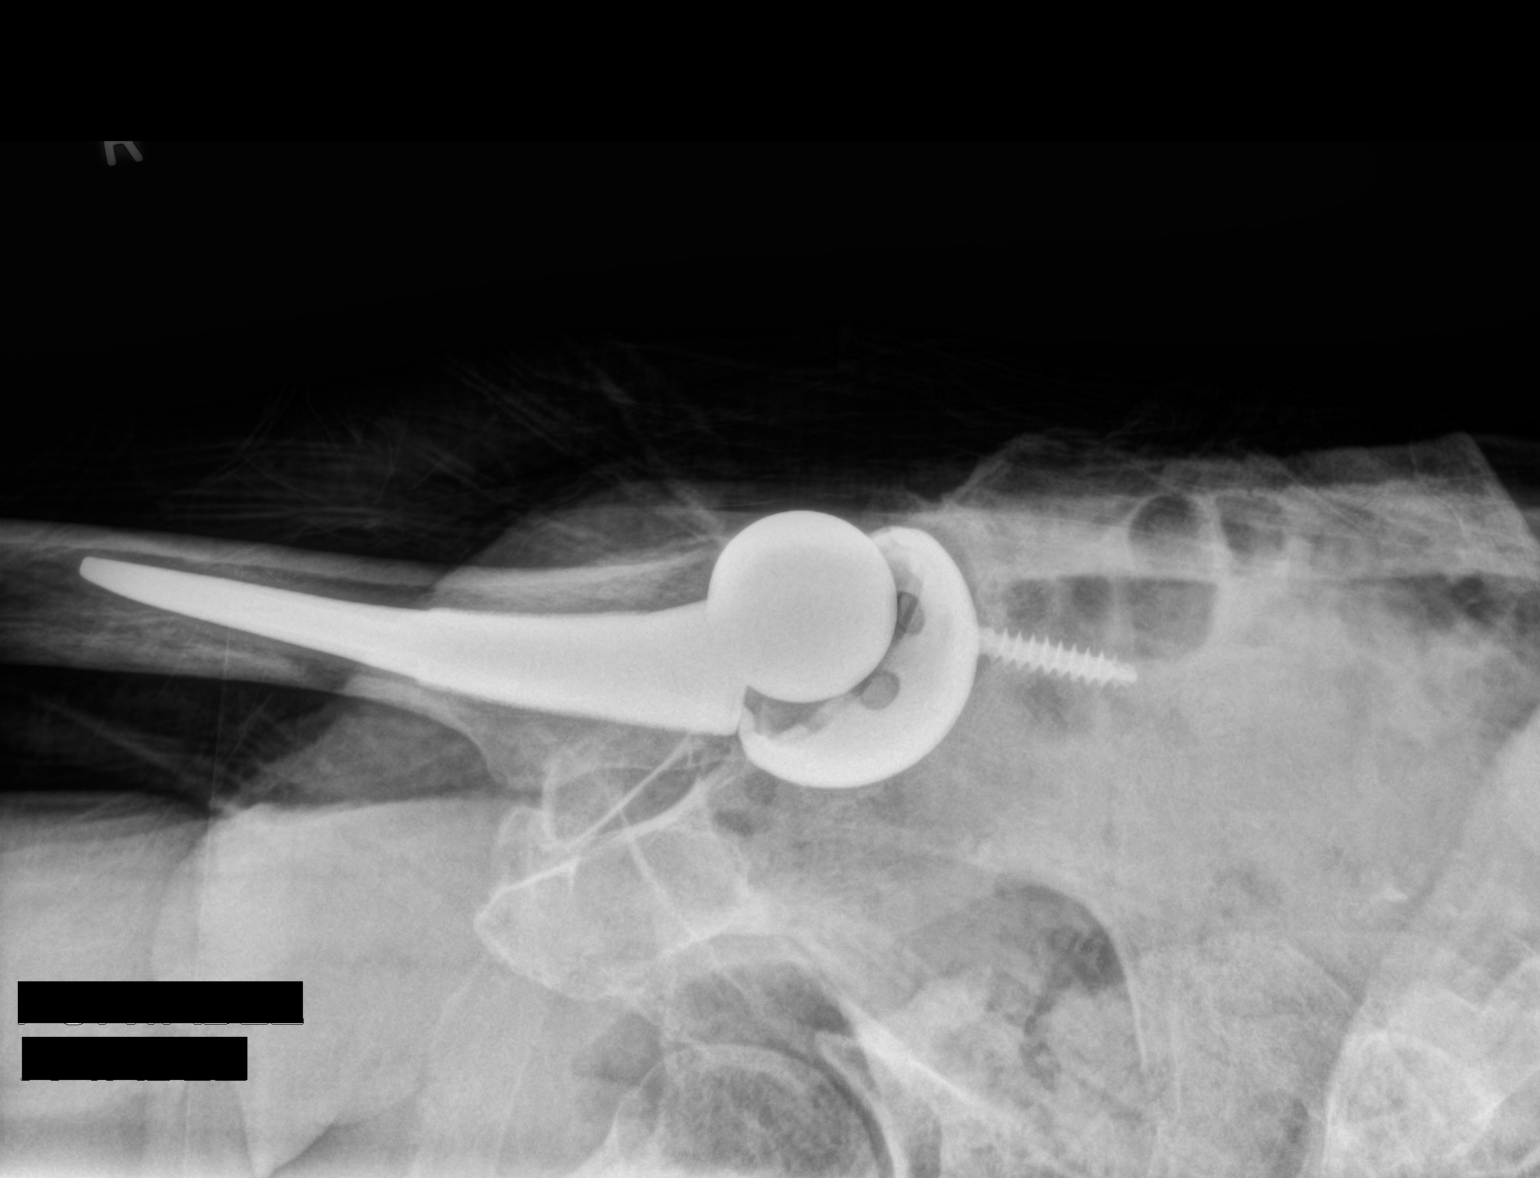

[2 of 2 positions shown; findings below may reference images not displayed]

FINDINGS: Right total hip arthroplasty is in place. The device is located and
there is no fracture. Gas in the soft tissues from surgery noted.
Left hip osteoarthritis is also noted.
IMPRESSION: Right total hip replacement without complicating feature.

## 2016-06-06 DIAGNOSIS — L570 Actinic keratosis: Secondary | ICD-10-CM | POA: Diagnosis not present

## 2016-06-06 DIAGNOSIS — Z85820 Personal history of malignant melanoma of skin: Secondary | ICD-10-CM | POA: Diagnosis not present

## 2016-06-06 DIAGNOSIS — Z85828 Personal history of other malignant neoplasm of skin: Secondary | ICD-10-CM | POA: Diagnosis not present

## 2016-06-06 DIAGNOSIS — D485 Neoplasm of uncertain behavior of skin: Secondary | ICD-10-CM | POA: Diagnosis not present

## 2016-06-06 DIAGNOSIS — L821 Other seborrheic keratosis: Secondary | ICD-10-CM | POA: Diagnosis not present

## 2016-06-06 DIAGNOSIS — L812 Freckles: Secondary | ICD-10-CM | POA: Diagnosis not present

## 2016-06-30 ENCOUNTER — Encounter: Payer: Self-pay | Admitting: Gynecology

## 2016-06-30 DIAGNOSIS — M8589 Other specified disorders of bone density and structure, multiple sites: Secondary | ICD-10-CM | POA: Diagnosis not present

## 2016-06-30 DIAGNOSIS — Z1231 Encounter for screening mammogram for malignant neoplasm of breast: Secondary | ICD-10-CM | POA: Diagnosis not present

## 2016-07-02 ENCOUNTER — Encounter: Payer: Self-pay | Admitting: Anesthesiology

## 2016-07-06 DIAGNOSIS — Z23 Encounter for immunization: Secondary | ICD-10-CM | POA: Diagnosis not present

## 2016-08-13 DIAGNOSIS — S41112A Laceration without foreign body of left upper arm, initial encounter: Secondary | ICD-10-CM | POA: Diagnosis not present

## 2016-08-13 DIAGNOSIS — M79602 Pain in left arm: Secondary | ICD-10-CM | POA: Diagnosis not present

## 2016-08-13 DIAGNOSIS — S80812A Abrasion, left lower leg, initial encounter: Secondary | ICD-10-CM | POA: Diagnosis not present

## 2016-08-16 DIAGNOSIS — S80812A Abrasion, left lower leg, initial encounter: Secondary | ICD-10-CM | POA: Diagnosis not present

## 2016-08-16 DIAGNOSIS — S41112A Laceration without foreign body of left upper arm, initial encounter: Secondary | ICD-10-CM | POA: Diagnosis not present

## 2016-08-16 DIAGNOSIS — M79602 Pain in left arm: Secondary | ICD-10-CM | POA: Diagnosis not present

## 2016-08-22 DIAGNOSIS — D1801 Hemangioma of skin and subcutaneous tissue: Secondary | ICD-10-CM | POA: Diagnosis not present

## 2016-08-22 DIAGNOSIS — D1722 Benign lipomatous neoplasm of skin and subcutaneous tissue of left arm: Secondary | ICD-10-CM | POA: Diagnosis not present

## 2016-08-22 DIAGNOSIS — D485 Neoplasm of uncertain behavior of skin: Secondary | ICD-10-CM | POA: Diagnosis not present

## 2016-08-22 DIAGNOSIS — C44629 Squamous cell carcinoma of skin of left upper limb, including shoulder: Secondary | ICD-10-CM | POA: Diagnosis not present

## 2016-08-22 DIAGNOSIS — L821 Other seborrheic keratosis: Secondary | ICD-10-CM | POA: Diagnosis not present

## 2016-08-22 DIAGNOSIS — L814 Other melanin hyperpigmentation: Secondary | ICD-10-CM | POA: Diagnosis not present

## 2016-08-22 DIAGNOSIS — L820 Inflamed seborrheic keratosis: Secondary | ICD-10-CM | POA: Diagnosis not present

## 2016-08-22 DIAGNOSIS — L570 Actinic keratosis: Secondary | ICD-10-CM | POA: Diagnosis not present

## 2016-08-23 DIAGNOSIS — S41112D Laceration without foreign body of left upper arm, subsequent encounter: Secondary | ICD-10-CM | POA: Diagnosis not present

## 2016-08-23 DIAGNOSIS — Z4802 Encounter for removal of sutures: Secondary | ICD-10-CM | POA: Diagnosis not present

## 2016-09-11 DIAGNOSIS — Z4802 Encounter for removal of sutures: Secondary | ICD-10-CM | POA: Diagnosis not present

## 2016-09-11 DIAGNOSIS — S41112A Laceration without foreign body of left upper arm, initial encounter: Secondary | ICD-10-CM | POA: Diagnosis not present

## 2016-10-16 DIAGNOSIS — S81802A Unspecified open wound, left lower leg, initial encounter: Secondary | ICD-10-CM | POA: Diagnosis not present

## 2016-10-16 DIAGNOSIS — C44729 Squamous cell carcinoma of skin of left lower limb, including hip: Secondary | ICD-10-CM | POA: Diagnosis not present

## 2017-01-13 DIAGNOSIS — G44009 Cluster headache syndrome, unspecified, not intractable: Secondary | ICD-10-CM | POA: Diagnosis not present

## 2017-02-12 DIAGNOSIS — H2513 Age-related nuclear cataract, bilateral: Secondary | ICD-10-CM | POA: Diagnosis not present

## 2017-02-25 ENCOUNTER — Encounter: Payer: Self-pay | Admitting: Gynecology

## 2017-03-23 DIAGNOSIS — M25552 Pain in left hip: Secondary | ICD-10-CM | POA: Diagnosis not present

## 2017-03-23 DIAGNOSIS — M1612 Unilateral primary osteoarthritis, left hip: Secondary | ICD-10-CM | POA: Diagnosis not present

## 2017-05-04 ENCOUNTER — Encounter: Payer: Self-pay | Admitting: Gynecology

## 2017-05-04 ENCOUNTER — Ambulatory Visit (INDEPENDENT_AMBULATORY_CARE_PROVIDER_SITE_OTHER): Payer: Medicare Other | Admitting: Gynecology

## 2017-05-04 VITALS — BP 148/80 | Ht 59.0 in | Wt 99.0 lb

## 2017-05-04 DIAGNOSIS — M8588 Other specified disorders of bone density and structure, other site: Secondary | ICD-10-CM

## 2017-05-04 DIAGNOSIS — M8580 Other specified disorders of bone density and structure, unspecified site: Secondary | ICD-10-CM

## 2017-05-04 DIAGNOSIS — Z01419 Encounter for gynecological examination (general) (routine) without abnormal findings: Secondary | ICD-10-CM | POA: Diagnosis not present

## 2017-05-04 NOTE — Progress Notes (Signed)
Veronica Wells [name] 1935/11/28 [id number]   History:    81 y.o.  for annual gyn exam with no complaints today. Patient has not been seen the office since 2014.Review of patient's records indicated that she has been followed for a long time for her osteopenia. She took Actonel for 6 years and has been on a drug holiday. Patient's last bone density study in 2017 demonstrated that her lowest T score was at the left femoral neck with a value of -1.50 with a normal Frax analysis. Patient with no previous history of any abnormal Pap smear. Patient has had a history of melanoma on the right arm in 2003 and is followed by dermatologist here in [place] and also in [place]. Patient's last mammogram was normal although dense in 2013. Patient has had history of benign colon polyp removed in 2010 her mother had history of colon cancer. Patient on no hormone replacement therapy.   patient with left axillary mass had been sent for diagnostic mammogram and ultrasound in 2014 and 15 which demonstrated large axillary lipoma which is not bothered the patient any.  Past medical history,surgical history, family history and social history were all reviewed and documented in the EPIC chart.  Gynecologic History No LMP recorded. Patient is postmenopausal. Contraception: post menopausal status Last Pap: 2012. Results were: normal Last mammogram: 2017. Results were: See above  Obstetric History OB History  Gravida Para Term Preterm AB Living  3 3 3     3   SAB TAB Ectopic Multiple Live Births               # Outcome Date GA Lbr Len/2nd Weight Sex Delivery Anes PTL Lv  3 Term           2 Term           1 Term                ROS: A ROS was performed and pertinent positives and negatives are included in the history.  GENERAL: No fevers or chills. HEENT: No change in vision, no earache, sore throat or sinus congestion. NECK: No pain or stiffness. CARDIOVASCULAR: No chest pain or pressure. No  palpitations. PULMONARY: No shortness of breath, cough or wheeze. GASTROINTESTINAL: No abdominal pain, nausea, vomiting or diarrhea, melena or bright red blood per rectum. GENITOURINARY: No urinary frequency, urgency, hesitancy or dysuria. MUSCULOSKELETAL: No joint or muscle pain, no back pain, no recent trauma. DERMATOLOGIC: No rash, no itching, no lesions. ENDOCRINE: No polyuria, polydipsia, no heat or cold intolerance. No recent change in weight. HEMATOLOGICAL: No anemia or easy bruising or bleeding. NEUROLOGIC: No headache, seizures, numbness, tingling or weakness. PSYCHIATRIC: No depression, no loss of interest in normal activity or change in sleep pattern.     Exam: chaperone present  BP (!) 148/80   Ht 4\' 11"  (1.499 m)   Wt 99 lb (44.9 kg)   BMI 20.00 kg/m   Body mass index is 20 kg/m.  General appearance : Well developed well nourished female. No acute distress HEENT: Eyes: no retinal hemorrhage or exudates,  Neck supple, trachea midline, no carotid bruits, no thyroidmegaly Lungs: Clear to auscultation, no rhonchi or wheezes, or rib retractions  Heart: Regular rate and rhythm, no murmurs or gallops Breast:Examined in sitting and supine position were symmetrical in appearance,left axillary lipoma,  no skin retraction, no nipple inversion, no nipple discharge, no skin discoloration, no axillary or supraclavicular lymphadenopathy Abdomen: no palpable masses or  tenderness, no rebound or guarding Extremities: no edema or skin discoloration or tenderness  Pelvic:  Bartholin, Urethra, Skene Glands: Within normal limits             Vagina: No gross lesions or discharge, Atrophic changes  Cervix: No gross lesions or discharge  Uterus  anteverted, normal size, shape and consistency, non-tender and mobile  Adnexa  Without masses or tenderness  Anus and perineum  normal   Rectovaginal  normal sphincter tone without palpated masses or tenderness             Hemoccult PCP provides      Assessment/Plan:  81 y.o. female for annual exam with left axillary lipoma has done well does not request removal has had ultrasound and mammograms as described above. Patient due for mammogram this September. Patient also will need a bone density study next year. We discussed importance of calcium vitamin D and weightbearing exercise for osteoporosis prevention. She will continue to follow-up with her dermatologist as a result of her history of melanoma. No blood work done today.   Terrance Mass MD, 4:08 PM 05/04/2017

## 2017-06-09 DIAGNOSIS — D0359 Melanoma in situ of other part of trunk: Secondary | ICD-10-CM | POA: Diagnosis not present

## 2017-06-09 DIAGNOSIS — Z85820 Personal history of malignant melanoma of skin: Secondary | ICD-10-CM | POA: Diagnosis not present

## 2017-06-09 DIAGNOSIS — L821 Other seborrheic keratosis: Secondary | ICD-10-CM | POA: Diagnosis not present

## 2017-06-09 DIAGNOSIS — D1801 Hemangioma of skin and subcutaneous tissue: Secondary | ICD-10-CM | POA: Diagnosis not present

## 2017-06-09 DIAGNOSIS — M1612 Unilateral primary osteoarthritis, left hip: Secondary | ICD-10-CM | POA: Diagnosis not present

## 2017-06-09 DIAGNOSIS — Z85828 Personal history of other malignant neoplasm of skin: Secondary | ICD-10-CM | POA: Diagnosis not present

## 2017-06-09 DIAGNOSIS — D485 Neoplasm of uncertain behavior of skin: Secondary | ICD-10-CM | POA: Diagnosis not present

## 2017-06-09 DIAGNOSIS — L812 Freckles: Secondary | ICD-10-CM | POA: Diagnosis not present

## 2017-06-19 ENCOUNTER — Encounter: Payer: Self-pay | Admitting: Obstetrics & Gynecology

## 2017-06-19 DIAGNOSIS — Z1231 Encounter for screening mammogram for malignant neoplasm of breast: Secondary | ICD-10-CM | POA: Diagnosis not present

## 2017-06-22 DIAGNOSIS — M25552 Pain in left hip: Secondary | ICD-10-CM | POA: Diagnosis not present

## 2017-06-22 DIAGNOSIS — M1612 Unilateral primary osteoarthritis, left hip: Secondary | ICD-10-CM | POA: Diagnosis not present

## 2017-06-25 DIAGNOSIS — D0359 Melanoma in situ of other part of trunk: Secondary | ICD-10-CM | POA: Diagnosis not present

## 2017-06-25 DIAGNOSIS — Z85828 Personal history of other malignant neoplasm of skin: Secondary | ICD-10-CM | POA: Diagnosis not present

## 2017-06-25 DIAGNOSIS — Z85820 Personal history of malignant melanoma of skin: Secondary | ICD-10-CM | POA: Diagnosis not present

## 2017-07-19 DIAGNOSIS — Z23 Encounter for immunization: Secondary | ICD-10-CM | POA: Diagnosis not present

## 2017-10-19 DIAGNOSIS — L570 Actinic keratosis: Secondary | ICD-10-CM | POA: Diagnosis not present

## 2017-10-19 DIAGNOSIS — L820 Inflamed seborrheic keratosis: Secondary | ICD-10-CM | POA: Diagnosis not present

## 2017-10-19 DIAGNOSIS — D2239 Melanocytic nevi of other parts of face: Secondary | ICD-10-CM | POA: Diagnosis not present

## 2017-10-19 DIAGNOSIS — D492 Neoplasm of unspecified behavior of bone, soft tissue, and skin: Secondary | ICD-10-CM | POA: Diagnosis not present

## 2017-11-02 DIAGNOSIS — M25552 Pain in left hip: Secondary | ICD-10-CM | POA: Diagnosis not present

## 2017-11-02 DIAGNOSIS — Z681 Body mass index (BMI) 19 or less, adult: Secondary | ICD-10-CM | POA: Diagnosis not present

## 2017-11-02 DIAGNOSIS — Z0000 Encounter for general adult medical examination without abnormal findings: Secondary | ICD-10-CM | POA: Diagnosis not present

## 2017-11-02 DIAGNOSIS — M1612 Unilateral primary osteoarthritis, left hip: Secondary | ICD-10-CM | POA: Diagnosis not present

## 2017-11-05 DIAGNOSIS — M1612 Unilateral primary osteoarthritis, left hip: Secondary | ICD-10-CM | POA: Diagnosis not present

## 2017-11-05 DIAGNOSIS — M25552 Pain in left hip: Secondary | ICD-10-CM | POA: Diagnosis not present

## 2018-01-14 DIAGNOSIS — G44009 Cluster headache syndrome, unspecified, not intractable: Secondary | ICD-10-CM | POA: Diagnosis not present

## 2018-01-14 DIAGNOSIS — M25552 Pain in left hip: Secondary | ICD-10-CM | POA: Diagnosis not present

## 2018-03-22 DIAGNOSIS — M25552 Pain in left hip: Secondary | ICD-10-CM | POA: Diagnosis not present

## 2018-03-22 DIAGNOSIS — M1612 Unilateral primary osteoarthritis, left hip: Secondary | ICD-10-CM | POA: Diagnosis not present

## 2018-03-24 DIAGNOSIS — M25552 Pain in left hip: Secondary | ICD-10-CM | POA: Diagnosis not present

## 2018-03-26 DIAGNOSIS — Z85828 Personal history of other malignant neoplasm of skin: Secondary | ICD-10-CM | POA: Diagnosis not present

## 2018-03-26 DIAGNOSIS — D485 Neoplasm of uncertain behavior of skin: Secondary | ICD-10-CM | POA: Diagnosis not present

## 2018-03-26 DIAGNOSIS — L812 Freckles: Secondary | ICD-10-CM | POA: Diagnosis not present

## 2018-03-26 DIAGNOSIS — L570 Actinic keratosis: Secondary | ICD-10-CM | POA: Diagnosis not present

## 2018-03-26 DIAGNOSIS — L821 Other seborrheic keratosis: Secondary | ICD-10-CM | POA: Diagnosis not present

## 2018-03-26 DIAGNOSIS — Z85820 Personal history of malignant melanoma of skin: Secondary | ICD-10-CM | POA: Diagnosis not present

## 2018-03-26 DIAGNOSIS — D1801 Hemangioma of skin and subcutaneous tissue: Secondary | ICD-10-CM | POA: Diagnosis not present

## 2018-05-06 DIAGNOSIS — M25552 Pain in left hip: Secondary | ICD-10-CM | POA: Diagnosis not present

## 2018-05-06 DIAGNOSIS — M1612 Unilateral primary osteoarthritis, left hip: Secondary | ICD-10-CM | POA: Diagnosis not present

## 2018-06-21 DIAGNOSIS — M8589 Other specified disorders of bone density and structure, multiple sites: Secondary | ICD-10-CM | POA: Diagnosis not present

## 2018-06-21 DIAGNOSIS — Z1231 Encounter for screening mammogram for malignant neoplasm of breast: Secondary | ICD-10-CM | POA: Diagnosis not present

## 2018-07-02 NOTE — H&P (Signed)
TOTAL HIP ADMISSION H&P  Patient is admitted for left total hip arthroplasty, anterior approach.  Subjective:  Chief Complaint:  Left hip primary OA / pain  HPI: Veronica Wells, 82 y.o. female, has a history of pain and functional disability in the left hip due to arthritis and patient has failed non-surgical conservative treatments for greater than 12 weeks to include NSAID's and/or analgesics, corticosteriod injections and activity modification.  Onset of symptoms was gradual starting ~1 years ago with gradually worsening course since that time.The patient noted prior procedures of the hip to include arthroplasty on the right hip(s).  Patient currently rates pain in the left hip at 8 out of 10 with activity. Patient has night pain, worsening of pain with activity and weight bearing, trendelenberg gait, pain that interfers with activities of daily living and pain with passive range of motion. Patient has evidence of periarticular osteophytes and joint space narrowing by imaging studies. This condition presents safety issues increasing the risk of falls.  There is no current active infection.  Risks, benefits and expectations were discussed with the patient.  Risks including but not limited to the risk of anesthesia, blood clots, nerve damage, blood vessel damage, failure of the prosthesis, infection and up to and including death.  Patient understand the risks, benefits and expectations and wishes to proceed with surgery.   D/C Plans:       Home  Post-op Meds:        No Rx given   Tranexamic Acid:      To be given - IV   Decadron:      Is to be given  FYI:     ASA  Norco  DME:   Pt already has equipment  PT:    No PT    Patient Active Problem List   Diagnosis Date Noted  . S/P right THA, AA 06/19/2015  . Lipoma of axilla 06/17/2013  . Prolapsed internal hemorrhoids 06/17/2013  . Axillary mass 06/07/2013  . Osteopenia   . Hemorrhoids   . SINUSITIS - ACUTE-NOS 02/21/2010  .  OSTEOARTHRITIS, KNEES, BILATERAL 02/21/2010  . HEADACHE 02/21/2010  . COLONIC POLYPS, HX OF 02/21/2010   Past Medical History:  Diagnosis Date  . Arthritis   . Cancer ([place]) 2003   melanoma on back right arm  . Difficulty sleeping   . Environmental allergies   . Headaches, cluster   . Hemorrhoids   . Osteopenia     Past Surgical History:  Procedure Laterality Date  . KNEE SURGERY  2011   Replacement/right knee  . MELANOMA EXCISION  2003  . TOTAL HIP ARTHROPLASTY Right 06/19/2015   Procedure: RIGHT TOTAL HIP ARTHROPLASTY ANTERIOR APPROACH;  Surgeon: Paralee Cancel, MD;  Location: [facility];  Service: Orthopedics;  Laterality: Right;  . TUBAL LIGATION      No current facility-administered medications for this encounter.    Current Outpatient Medications  Medication Sig Dispense Refill Last Dose  . aspirin EC 81 MG tablet Take 81 mg by mouth daily.   Taking  . Calcium Carbonate-Vitamin D (CALCIUM + D PO) Take 600 mg by mouth 2 (two) times daily. Take 2 pills daily   Taking   No Known Allergies   Social History   Tobacco Use  . Smoking status: Former Smoker    Types: Cigarettes    Last attempt to quit: 06/28/1975    Years since quitting: 43.0  . Smokeless tobacco: Never Used  Substance Use Topics  .  Alcohol use: Yes    Alcohol/week: 3.0 standard drinks    Types: 3 Glasses of wine per week    Family History  Problem Relation Age of Onset  . Cancer Mother        Colon  . Osteoporosis Mother   . Colon cancer Mother   . Heart disease Father   . Heart failure Father   . Breast cancer Paternal Aunt        Age50's  . Breast cancer Paternal Aunt        Age 9's  . Osteoporosis Maternal Aunt   . Breast cancer Cousin        Age 57's  . Stomach cancer Neg Hx      Review of Systems  Constitutional: Negative.   HENT: Negative.   Eyes: Negative.   Respiratory: Negative.   Cardiovascular: Negative.   Gastrointestinal: Negative.   Genitourinary: Negative.   Musculoskeletal:  Positive for joint pain.  Skin: Negative.   Neurological: Positive for headaches.  Endo/Heme/Allergies: Negative.   Psychiatric/Behavioral: Negative.     Objective:  Physical Exam  Constitutional: She is oriented to person, place, and time. She appears well-developed.  HENT:  Head: Normocephalic.  Eyes: Pupils are equal, round, and reactive to light.  Neck: Neck supple. No JVD present. No tracheal deviation present. No thyromegaly present.  Cardiovascular: Normal rate, regular rhythm and intact distal pulses.  Respiratory: Effort normal and breath sounds normal. No respiratory distress. She has no wheezes.  GI: Soft. There is no tenderness. There is no guarding.  Musculoskeletal:       Left hip: She exhibits decreased range of motion, decreased strength, tenderness and bony tenderness. She exhibits no swelling, no deformity and no laceration.  Lymphadenopathy:    She has no cervical adenopathy.  Neurological: She is alert and oriented to person, place, and time.  Skin: Skin is warm and dry.  Psychiatric: She has a normal mood and affect.      Labs:  Estimated body mass index is 20 kg/m as calculated from the following:   Height as of 05/04/17: 4\' 11"  (1.499 m).   Weight as of 05/04/17: 44.9 kg.   Imaging Review Plain radiographs demonstrate severe degenerative joint disease of the left hip. The bone quality appears to be good for age and reported activity level.    Preoperative templating of the joint replacement has been completed, documented, and submitted to the Operating Room personnel in order to optimize intra-operative equipment management.     Assessment/Plan:  End stage arthritis, left hip  The patient history, physical examination, clinical judgement of the provider and imaging studies are consistent with end stage degenerative joint disease of the left hip and total hip arthroplasty is deemed medically necessary. The treatment options including medical  management, injection therapy, arthroscopy and arthroplasty were discussed at length. The risks and benefits of total hip arthroplasty were presented and reviewed. The risks due to aseptic loosening, infection, stiffness, dislocation/subluxation,  thromboembolic complications and other imponderables were discussed.  The patient acknowledged the explanation, agreed to proceed with the plan and consent was signed. Patient is being admitted for inpatient treatment for surgery, pain control, PT, OT, prophylactic antibiotics, VTE prophylaxis, progressive ambulation and ADL's and discharge planning.The patient is planning to be discharged home.      West Pugh Glada Wickstrom   PA-C  07/02/2018, 12:49 PM

## 2018-07-05 ENCOUNTER — Ambulatory Visit (INDEPENDENT_AMBULATORY_CARE_PROVIDER_SITE_OTHER): Payer: Medicare Other | Admitting: Family Medicine

## 2018-07-05 ENCOUNTER — Encounter: Payer: Self-pay | Admitting: Family Medicine

## 2018-07-05 VITALS — BP 138/60 | HR 55 | Temp 98.0°F | Ht <= 58 in | Wt 97.9 lb

## 2018-07-05 DIAGNOSIS — G44021 Chronic cluster headache, intractable: Secondary | ICD-10-CM

## 2018-07-05 DIAGNOSIS — Z01810 Encounter for preprocedural cardiovascular examination: Secondary | ICD-10-CM | POA: Diagnosis not present

## 2018-07-05 DIAGNOSIS — Z13220 Encounter for screening for lipoid disorders: Secondary | ICD-10-CM

## 2018-07-05 LAB — LIPID PANEL
CHOLESTEROL: 237 mg/dL — AB (ref 0–200)
HDL: 67.6 mg/dL (ref >39.00)
LDL Cholesterol: 143 mg/dL — ABNORMAL HIGH (ref 0–99)
NonHDL: 169.55
Total CHOL/HDL Ratio: 4
Triglycerides: 134 mg/dL (ref 0.0–149.0)
VLDL: 26.8 mg/dL (ref 0.0–40.0)

## 2018-07-05 LAB — COMPREHENSIVE METABOLIC PANEL
ALBUMIN: 3.9 g/dL (ref 3.5–5.2)
ALK PHOS: 70 U/L (ref 39–117)
ALT: 11 U/L (ref 0–35)
AST: 14 U/L (ref 0–37)
BUN: 32 mg/dL — AB (ref 6–23)
CO2: 30 mEq/L (ref 19–32)
Calcium: 9.6 mg/dL (ref 8.4–10.5)
Chloride: 104 mEq/L (ref 96–112)
Creatinine, Ser: 0.92 mg/dL (ref 0.40–1.20)
GFR: 62.45 mL/min (ref >60.00)
Glucose, Bld: 105 mg/dL — ABNORMAL HIGH (ref 70–99)
Potassium: 4.6 mEq/L (ref 3.5–5.1)
SODIUM: 140 meq/L (ref 135–145)
TOTAL PROTEIN: 6.7 g/dL (ref 6.0–8.3)
Total Bilirubin: 0.3 mg/dL (ref 0.2–1.2)

## 2018-07-05 LAB — CBC WITH DIFFERENTIAL/PLATELET
BASOS ABS: 0 10*3/uL (ref 0.0–0.1)
Basophils Relative: 0.5 % (ref 0.0–3.0)
Eosinophils Absolute: 0.2 10*3/uL (ref 0.0–0.7)
Eosinophils Relative: 4.5 % (ref 0.0–5.0)
HCT: 40.5 % (ref 36.0–46.0)
Hemoglobin: 13.7 g/dL (ref 12.0–15.0)
LYMPHS ABS: 1.1 10*3/uL (ref 0.7–4.0)
Lymphocytes Relative: 25.1 % (ref 12.0–46.0)
MCHC: 33.8 g/dL (ref 30.0–36.0)
MCV: 92.2 fl (ref 78.0–100.0)
Monocytes Absolute: 0.4 10*3/uL (ref 0.1–1.0)
Monocytes Relative: 9 % (ref 3.0–12.0)
NEUTROS ABS: 2.8 10*3/uL (ref 1.4–7.7)
Neutrophils Relative %: 60.9 % (ref 43.0–77.0)
PLATELETS: 246 10*3/uL (ref 150.0–400.0)
RBC: 4.4 Mil/uL (ref 3.87–5.11)
RDW: 14 % (ref 11.5–15.5)
WBC: 4.5 10*3/uL (ref 4.0–10.5)

## 2018-07-05 LAB — URINALYSIS
Bilirubin Urine: NEGATIVE
HGB URINE DIPSTICK: NEGATIVE
Ketones, ur: NEGATIVE
LEUKOCYTES UA: NEGATIVE
NITRITE: NEGATIVE
Specific Gravity, Urine: 1.025 (ref 1.000–1.030)
Total Protein, Urine: NEGATIVE
URINE GLUCOSE: NEGATIVE
UROBILINOGEN UA: 0.2 (ref 0.0–1.0)
pH: 5.5 (ref 5.0–8.0)

## 2018-07-05 NOTE — Progress Notes (Signed)
Veronica Wells DOB: November 02, 1935 Encounter date: 07/05/2018  This is a 82 y.o. female who presents to establish care. Chief Complaint  Patient presents with  . New Patient (Initial Visit)    no new concerns    History of present illness: Previous physician passed away. She has doctor in [place] she sees for 6 months of the year. Usually leaves in October.   Right hip did well with surgery. Getting left hip done next week.  Right knee is doing well; left knee doing ok. Really just left hip that is bothering her.   Gets cluster headaches - has since she was 60. Gets them about every other year. Usually start in April. Takes depakote but hasn't found something that works. Has tried multiple other therapies over the year.   Last colonoscopy in 2015; Dr. Velora Heckler.   Walks and rides bike every day and plays tennis.    No history of problems with anesthesia. No blood clots in past. No problems with excessive bleeding.   Surgery is scheduled for total left hip on October 1st with Dr. Alvan Dame.   Past Medical History:  Diagnosis Date  . Arthritis   . Cancer ([place]) 2003   melanoma on back right arm  . Difficulty sleeping   . Environmental allergies   . Headaches, cluster   . Hemorrhoids   . Osteopenia    Past Surgical History:  Procedure Laterality Date  . KNEE SURGERY  2011   Replacement/right knee  . MELANOMA EXCISION  2003  . TOTAL HIP ARTHROPLASTY Right 06/19/2015   Procedure: RIGHT TOTAL HIP ARTHROPLASTY ANTERIOR APPROACH;  Surgeon: Paralee Cancel, MD;  Location: [facility];  Service: Orthopedics;  Laterality: Right;  . TUBAL LIGATION     No Known Allergies Current Meds  Medication Sig  . aspirin EC 81 MG tablet Take 81 mg by mouth daily.  . Calcium Carbonate-Vitamin D (CALCIUM + D PO) Take 1 tablet by mouth daily.    Social History   Tobacco Use  . Smoking status: Former Smoker    Types: Cigarettes    Last attempt to quit: 06/28/1975    Years since quitting: 43.0  .  Smokeless tobacco: Never Used  Substance Use Topics  . Alcohol use: Yes    Alcohol/week: 3.0 standard drinks    Types: 3 Glasses of wine per week   Family History  Problem Relation Age of Onset  . Cancer Mother 64       Colon  . Osteoporosis Mother   . Colon cancer Mother   . Heart disease Father   . Heart failure Father   . Breast cancer Paternal Aunt        Age50's  . Breast cancer Paternal Aunt        Age 44's  . Osteoporosis Maternal Aunt   . Breast cancer Cousin        Age 2's  . Asthma Son   . Stomach cancer Neg Hx      Review of Systems  Constitutional: Negative for chills, fatigue and fever.  HENT: Negative for congestion and sore throat.   Respiratory: Negative for cough, chest tightness, shortness of breath and wheezing.   Cardiovascular: Negative for chest pain, palpitations and leg swelling.  Gastrointestinal: Negative for abdominal pain, constipation, diarrhea, nausea and vomiting.  Musculoskeletal: Positive for arthralgias (notes hip pain with trying to step up onto stool, bending over. ). Negative for back pain.  Skin: Negative for rash.  Allergic/Immunologic: Positive for  environmental allergies (grass).  Psychiatric/Behavioral: Negative for sleep disturbance. The patient is not nervous/anxious.     Objective:  BP 138/60 (BP Location: Left Arm, Patient Position: Sitting, Cuff Size: Normal)   Pulse (!) 55   Temp 98 F (36.7 C) (Oral)   Ht 4\' 10"  (1.473 m)   Wt 97 lb 14.4 oz (44.4 kg)   SpO2 91%   BMI 20.46 kg/m   Weight: 97 lb 14.4 oz (44.4 kg)   BP Readings from Last 3 Encounters:  07/05/18 138/60  05/04/17 (!) 148/80  06/20/15 (!) 86/52   Wt Readings from Last 3 Encounters:  07/05/18 97 lb 14.4 oz (44.4 kg)  05/04/17 99 lb (44.9 kg)  06/19/15 99 lb (44.9 kg)    Physical Exam  Constitutional: She is oriented to person, place, and time. She appears well-developed and well-nourished. No distress.  Cardiovascular: Normal rate, regular  rhythm and normal heart sounds. Exam reveals no friction rub.  No murmur heard. No lower extremity edema  Pulmonary/Chest: Effort normal and breath sounds normal. No respiratory distress. She has no decreased breath sounds. She has no wheezes. She has no rhonchi. She has no rales.  Abdominal: Soft. Normal appearance and bowel sounds are normal. There is no tenderness.  Neurological: She is alert and oriented to person, place, and time.  Psychiatric: She has a normal mood and affect. Her speech is normal and behavior is normal.    Assessment/Plan: 1. Preoperative cardiovascular examination Will get baseline bloodwork. She is very healthy and unless significant abnormality in bloodwork I do not feel that any further preoperative assessment is needed.  (labwork results returned prior to note signing and all labs, UA are normal/stable)  1. Preoperative workup as follows:ECG, basic labwork, UA. EKG reveals no acute changes and is stable from previous in 2011. Slight bradycardia. 2. Change in medication regimen before surgery: hold aspirin as directed by ortho. 3. No contraindications to planned surgery   - CBC with Differential/Platelet; Future - Comprehensive metabolic panel; Future - Urinalysis - EKG 12-Lead  2. Intractable chronic cluster headache Follows with neurology in [place]. I have asked her to have them send [place] records on next visit.  3. Lipid screening - Lipid panel; Future  Return in about 1 year (around 07/06/2019) for physical exam.  Micheline Rough, MD

## 2018-07-06 NOTE — Progress Notes (Signed)
07/05/2018- noted EKG in Epic.  07/05/2018- noted labs in Epic- CBC w/ diff., CMP, Lipid,panel, U/A

## 2018-07-06 NOTE — Patient Instructions (Addendum)
Veronica Wells der Veronica Wells  07/06/2018   Your procedure is scheduled on: Tuesday 07/13/2018  Report to [facility] Main  Entrance              Report to admitting at  1150 AM    Call this number if you have problems the morning of surgery [phone]    Remember: Do not eat food  :After Midnight. You may have clear liquids from midnight up until 0820 am then nothing until after surgery!    CLEAR LIQUID DIET   Foods Allowed                                                                     Foods Excluded  Coffee and tea, regular and decaf                             liquids that you cannot  Plain Jell-O in any flavor                                             see through such as: Fruit ices (not with fruit pulp)                                     milk, soups, orange juice  Iced Popsicles                                    All solid food Carbonated beverages, regular and diet                                    Cranberry, grape and apple juices Sports drinks like Gatorade Lightly seasoned clear broth or consume(fat free) Sugar, honey syrup  Sample Menu Breakfast                                Lunch                                     Supper Cranberry juice                    Beef broth                            Chicken broth Jell-O                                     Grape juice  Apple juice Coffee or tea                        Jell-O                                      Popsicle                                                Coffee or tea                        Coffee or tea  _____________________________________________________________________    BRUSH YOUR TEETH MORNING OF SURGERY AND RINSE YOUR MOUTH OUT, NO CHEWING GUM CANDY OR MINTS.     Take these medicines the morning of surgery with A SIP OF WATER: none                                You may not have any metal on your body including hair pins and               piercings  Do not wear jewelry, make-up, lotions, powders or perfumes, deodorant             Do not wear nail polish.  Do not shave  48 hours prior to surgery.              Do not bring valuables to the hospital. [place].  Contacts, dentures or bridgework may not be worn into surgery.  Leave suitcase in the car. After surgery it may be brought to your room.                 Please read over the following fact sheets you were given: _____________________________________________________________________             [facility] - Preparing for Surgery Before surgery, you can play an important role.  Because skin is not sterile, your skin needs to be as free of germs as possible.  You can reduce the number of germs on your skin by washing with CHG (chlorahexidine gluconate) soap before surgery.  CHG is an antiseptic cleaner which kills germs and bonds with the skin to continue killing germs even after washing. Please DO NOT use if you have an allergy to CHG or antibacterial soaps.  If your skin becomes reddened/irritated stop using the CHG and inform your nurse when you arrive at Short Stay. Do not shave (including legs and underarms) for at least 48 hours prior to the first CHG shower.  You may shave your face/neck. Please follow these instructions carefully:  1.  Shower with CHG Soap the night before surgery and the  morning of Surgery.  2.  If you choose to wash your hair, wash your hair first as usual with your  normal  shampoo.  3.  After you shampoo, rinse your hair and body thoroughly to remove the  shampoo.  4.  Use CHG as you would any other liquid soap.  You can apply chg directly  to the skin and wash                       Gently with a scrungie or clean washcloth.  5.  Apply the CHG Soap to your body ONLY FROM THE NECK DOWN.   Do not use on face/ open                           Wound or open sores. Avoid  contact with eyes, ears mouth and genitals (private parts).                       Wash face,  Genitals (private parts) with your normal soap.             6.  Wash thoroughly, paying special attention to the area where your surgery  will be performed.  7.  Thoroughly rinse your body with warm water from the neck down.  8.  DO NOT shower/wash with your normal soap after using and rinsing off  the CHG Soap.                9.  Pat yourself dry with a clean towel.            10.  Wear clean pajamas.            11.  Place clean sheets on your bed the night of your first shower and do not  sleep with pets. Day of Surgery : Do not apply any lotions/deodorants the morning of surgery.  Please wear clean clothes to the hospital/surgery center.  FAILURE TO FOLLOW THESE INSTRUCTIONS MAY RESULT IN THE CANCELLATION OF YOUR SURGERY PATIENT SIGNATURE_________________________________  NURSE SIGNATURE__________________________________  ________________________________________________________________________   Veronica Wells  An incentive spirometer is a tool that can help keep your lungs clear and active. This tool measures how well you are filling your lungs with each breath. Taking long deep breaths may help reverse or decrease the chance of developing breathing (pulmonary) problems (especially infection) following:  A long period of time when you are unable to move or be active. BEFORE THE PROCEDURE   If the spirometer includes an indicator to show your best effort, your nurse or respiratory therapist will set it to a desired goal.  If possible, sit up straight or lean slightly forward. Try not to slouch.  Hold the incentive spirometer in an upright position. INSTRUCTIONS FOR USE  1. Sit on the edge of your bed if possible, or sit up as far as you can in bed or on a chair. 2. Hold the incentive spirometer in an upright position. 3. Breathe out normally. 4. Place the mouthpiece in your mouth  and seal your lips tightly around it. 5. Breathe in slowly and as deeply as possible, raising the piston or the ball toward the top of the column. 6. Hold your breath for 3-5 seconds or for as long as possible. Allow the piston or ball to fall to the bottom of the column. 7. Remove the mouthpiece from your mouth and breathe out normally. 8. Rest for a few seconds and repeat Steps 1 through 7 at least 10 times every 1-2 hours when you are awake. Take your time and take a few normal breaths between deep breaths. 9. The spirometer may include an indicator to  show your best effort. Use the indicator as a goal to work toward during each repetition. 10. After each set of 10 deep breaths, practice coughing to be sure your lungs are clear. If you have an incision (the cut made at the time of surgery), support your incision when coughing by placing a pillow or rolled up towels firmly against it. Once you are able to get out of bed, walk around indoors and cough well. You may stop using the incentive spirometer when instructed by your caregiver.  RISKS AND COMPLICATIONS  Take your time so you do not get dizzy or light-headed.  If you are in pain, you may need to take or ask for pain medication before doing incentive spirometry. It is harder to take a deep breath if you are having pain. AFTER USE  Rest and breathe slowly and easily.  It can be helpful to keep track of a log of your progress. Your caregiver can provide you with a simple table to help with this. If you are using the spirometer at home, follow these instructions: [place] IF:   You are having difficultly using the spirometer.  You have trouble using the spirometer as often as instructed.  Your pain medication is not giving enough relief while using the spirometer.  You develop fever of 100.5 F (38.1 C) or higher. SEEK IMMEDIATE MEDICAL CARE IF:   You cough up bloody sputum that had not been present before.  You develop  fever of 102 F (38.9 C) or greater.  You develop worsening pain at or near the incision site. MAKE SURE YOU:   Understand these instructions.  Will watch your condition.  Will get help right away if you are not doing well or get worse. Document Released: 02/09/2007 Document Revised: 12/22/2011 Document Reviewed: 04/12/2007 Exit[employer] Patient Information 2014 [employer], [place].   ________________________________________________________________________  WHAT IS A BLOOD TRANSFUSION? Blood Transfusion Information  A transfusion is the replacement of blood or some of its parts. Blood is made up of multiple cells which provide different functions.  Red blood cells carry oxygen and are used for blood loss replacement.  White blood cells fight against infection.  Platelets control bleeding.  Plasma helps clot blood.  Other blood products are available for specialized needs, such as hemophilia or other clotting disorders. BEFORE THE TRANSFUSION  Who gives blood for transfusions?   Healthy volunteers who are fully evaluated to make sure their blood is safe. This is blood bank blood. Transfusion therapy is the safest it has ever been in the practice of medicine. Before blood is taken from a donor, a complete history is taken to make sure that person has no history of diseases nor engages in risky social behavior (examples are intravenous drug use or sexual activity with multiple partners). The donor's travel history is screened to minimize risk of transmitting infections, such as malaria. The donated blood is tested for signs of infectious diseases, such as HIV and hepatitis. The blood is then tested to be sure it is compatible with you in order to minimize the chance of a transfusion reaction. If you or a relative donates blood, this is often done in anticipation of surgery and is not appropriate for emergency situations. It takes many days to process the donated blood. RISKS AND  COMPLICATIONS Although transfusion therapy is very safe and saves many lives, the main dangers of transfusion include:   Getting an infectious disease.  Developing a transfusion reaction. This is an allergic reaction  to something in the blood you were given. Every precaution is taken to prevent this. The decision to have a blood transfusion has been considered carefully by your caregiver before blood is given. Blood is not given unless the benefits outweigh the risks. AFTER THE TRANSFUSION  Right after receiving a blood transfusion, you will usually feel much better and more energetic. This is especially true if your red blood cells have gotten low (anemic). The transfusion raises the level of the red blood cells which carry oxygen, and this usually causes an energy increase.  The nurse administering the transfusion will monitor you carefully for complications. HOME CARE INSTRUCTIONS  No special instructions are needed after a transfusion. You may find your energy is better. Speak with your caregiver about any limitations on activity for underlying diseases you may have. SEEK MEDICAL CARE IF:   Your condition is not improving after your transfusion.  You develop redness or irritation at the intravenous (IV) site. SEEK IMMEDIATE MEDICAL CARE IF:  Any of the following symptoms occur over the next 12 hours:  Shaking chills.  You have a temperature by mouth above 102 F (38.9 C), not controlled by medicine.  Chest, back, or muscle pain.  People around you feel you are not acting correctly or are confused.  Shortness of breath or difficulty breathing.  Dizziness and fainting.  You get a rash or develop hives.  You have a decrease in urine output.  Your urine turns a dark color or changes to pink, red, or brown. Any of the following symptoms occur over the next 10 days:  You have a temperature by mouth above 102 F (38.9 C), not controlled by medicine.  Shortness of  breath.  Weakness after normal activity.  The white part of the eye turns yellow (jaundice).  You have a decrease in the amount of urine or are urinating less often.  Your urine turns a dark color or changes to pink, red, or brown. Document Released: 09/26/2000 Document Revised: 12/22/2011 Document Reviewed: 05/15/2008 [facility] Patient Information 2014 [place], [place].  _______________________________________________________________________

## 2018-07-07 ENCOUNTER — Encounter (HOSPITAL_COMMUNITY): Payer: Self-pay

## 2018-07-07 ENCOUNTER — Other Ambulatory Visit: Payer: Self-pay

## 2018-07-07 ENCOUNTER — Encounter (HOSPITAL_COMMUNITY)
Admission: RE | Admit: 2018-07-07 | Discharge: 2018-07-07 | Disposition: A | Discharge status: Home / Self Care | Payer: Medicare Other | Source: Ambulatory Visit | Attending: Orthopedic Surgery | Admitting: Orthopedic Surgery

## 2018-07-07 DIAGNOSIS — Z01818 Encounter for other preprocedural examination: Secondary | ICD-10-CM | POA: Diagnosis not present

## 2018-07-07 DIAGNOSIS — M1612 Unilateral primary osteoarthritis, left hip: Secondary | ICD-10-CM | POA: Insufficient documentation

## 2018-07-07 LAB — BASIC METABOLIC PANEL
Anion gap: 7 (ref 5–15)
BUN: 36 mg/dL — AB (ref 8–23)
CALCIUM: 9.3 mg/dL (ref 8.9–10.3)
CO2: 29 mmol/L (ref 22–32)
Chloride: 106 mmol/L (ref 98–111)
Creatinine, Ser: 0.97 mg/dL (ref 0.44–1.00)
GFR, EST NON AFRICAN AMERICAN: 54 mL/min — AB (ref >60)
Glucose, Bld: 81 mg/dL (ref 70–99)
Potassium: 4.5 mmol/L (ref 3.5–5.1)
SODIUM: 142 mmol/L (ref 135–145)

## 2018-07-07 LAB — SURGICAL PCR SCREEN
MRSA, PCR: NEGATIVE
STAPHYLOCOCCUS AUREUS: NEGATIVE

## 2018-07-13 ENCOUNTER — Inpatient Hospital Stay (HOSPITAL_COMMUNITY): Payer: Medicare Other

## 2018-07-13 ENCOUNTER — Inpatient Hospital Stay (HOSPITAL_COMMUNITY): Payer: Medicare Other | Admitting: Certified Registered Nurse Anesthetist

## 2018-07-13 ENCOUNTER — Other Ambulatory Visit: Payer: Self-pay

## 2018-07-13 ENCOUNTER — Inpatient Hospital Stay (HOSPITAL_COMMUNITY)
Admission: RE | Admit: 2018-07-13 | Discharge: 2018-07-14 | DRG: 470 | Disposition: A | Discharge status: Home / Self Care | Payer: Medicare Other | Source: Ambulatory Visit | Attending: Orthopedic Surgery | Admitting: Orthopedic Surgery

## 2018-07-13 ENCOUNTER — Encounter (HOSPITAL_COMMUNITY): Payer: Self-pay | Admitting: General Practice

## 2018-07-13 ENCOUNTER — Encounter (HOSPITAL_COMMUNITY)
Admission: RE | Disposition: A | Discharge status: Home / Self Care | Payer: Self-pay | Source: Ambulatory Visit | Attending: Orthopedic Surgery

## 2018-07-13 DIAGNOSIS — Z9851 Tubal ligation status: Secondary | ICD-10-CM

## 2018-07-13 DIAGNOSIS — Z471 Aftercare following joint replacement surgery: Secondary | ICD-10-CM | POA: Diagnosis not present

## 2018-07-13 DIAGNOSIS — M1612 Unilateral primary osteoarthritis, left hip: Principal | ICD-10-CM | POA: Diagnosis present

## 2018-07-13 DIAGNOSIS — Z8262 Family history of osteoporosis: Secondary | ICD-10-CM | POA: Diagnosis not present

## 2018-07-13 DIAGNOSIS — Z7982 Long term (current) use of aspirin: Secondary | ICD-10-CM | POA: Diagnosis not present

## 2018-07-13 DIAGNOSIS — Z87891 Personal history of nicotine dependence: Secondary | ICD-10-CM | POA: Diagnosis not present

## 2018-07-13 DIAGNOSIS — Z85820 Personal history of malignant melanoma of skin: Secondary | ICD-10-CM

## 2018-07-13 DIAGNOSIS — M8580 Other specified disorders of bone density and structure, unspecified site: Secondary | ICD-10-CM | POA: Diagnosis present

## 2018-07-13 DIAGNOSIS — Z8249 Family history of ischemic heart disease and other diseases of the circulatory system: Secondary | ICD-10-CM

## 2018-07-13 DIAGNOSIS — Z96651 Presence of right artificial knee joint: Secondary | ICD-10-CM | POA: Diagnosis present

## 2018-07-13 DIAGNOSIS — Z800 Family history of malignant neoplasm of digestive organs: Secondary | ICD-10-CM | POA: Diagnosis not present

## 2018-07-13 DIAGNOSIS — Z96642 Presence of left artificial hip joint: Secondary | ICD-10-CM

## 2018-07-13 DIAGNOSIS — Z803 Family history of malignant neoplasm of breast: Secondary | ICD-10-CM

## 2018-07-13 DIAGNOSIS — Z96641 Presence of right artificial hip joint: Secondary | ICD-10-CM | POA: Diagnosis present

## 2018-07-13 HISTORY — PX: TOTAL HIP ARTHROPLASTY: SHX124

## 2018-07-13 LAB — TYPE AND SCREEN
ABO/RH(D): O POS
ANTIBODY SCREEN: NEGATIVE

## 2018-07-13 SURGERY — ARTHROPLASTY, HIP, TOTAL, ANTERIOR APPROACH
Anesthesia: Monitor Anesthesia Care | Site: Hip | Laterality: Left

## 2018-07-13 MED ORDER — ASPIRIN 81 MG PO CHEW
81.0000 mg | CHEWABLE_TABLET | Freq: Two times a day (BID) | ORAL | Status: DC
  Administered 2018-07-13 – 2018-07-14 (×2): 81 mg via ORAL
  Filled 2018-07-13 (×2): qty 1

## 2018-07-13 MED ORDER — ACETAMINOPHEN 500 MG PO TABS
500.0000 mg | ORAL_TABLET | Freq: Once | ORAL | Status: AC
  Administered 2018-07-13: 500 mg via ORAL
  Filled 2018-07-13: qty 1

## 2018-07-13 MED ORDER — CEFAZOLIN SODIUM-DEXTROSE 1-4 GM/50ML-% IV SOLN
1.0000 g | Freq: Four times a day (QID) | INTRAVENOUS | Status: AC
  Administered 2018-07-13 – 2018-07-14 (×2): 1 g via INTRAVENOUS
  Filled 2018-07-13 (×2): qty 50

## 2018-07-13 MED ORDER — CALCIUM CARBONATE-VITAMIN D 500-200 MG-UNIT PO TABS
2.0000 | ORAL_TABLET | Freq: Every day | ORAL | Status: DC
  Administered 2018-07-14: 2 via ORAL
  Filled 2018-07-13: qty 2

## 2018-07-13 MED ORDER — HYDROCODONE-ACETAMINOPHEN 5-325 MG PO TABS
1.0000 | ORAL_TABLET | ORAL | Status: DC | PRN
  Administered 2018-07-13 – 2018-07-14 (×5): 2 via ORAL
  Filled 2018-07-13 (×5): qty 2

## 2018-07-13 MED ORDER — METHOCARBAMOL 500 MG PO TABS
500.0000 mg | ORAL_TABLET | Freq: Four times a day (QID) | ORAL | Status: DC | PRN
  Administered 2018-07-13 – 2018-07-14 (×3): 500 mg via ORAL
  Filled 2018-07-13 (×3): qty 1

## 2018-07-13 MED ORDER — DEXAMETHASONE SODIUM PHOSPHATE 10 MG/ML IJ SOLN
INTRAMUSCULAR | Status: DC | PRN
  Administered 2018-07-13: 10 mg via INTRAVENOUS

## 2018-07-13 MED ORDER — OXYCODONE HCL 5 MG PO TABS: 5.0000 mg | ORAL_TABLET | Freq: Once | ORAL | Status: DC | PRN

## 2018-07-13 MED ORDER — METHOCARBAMOL 500 MG IVPB - SIMPLE MED
500.0000 mg | Freq: Four times a day (QID) | INTRAVENOUS | Status: DC | PRN
  Administered 2018-07-13: 500 mg via INTRAVENOUS
  Filled 2018-07-13: qty 50

## 2018-07-13 MED ORDER — ALUM & MAG HYDROXIDE-SIMETH 200-200-20 MG/5ML PO SUSP: 30.0000 mL | ORAL | Status: DC | PRN

## 2018-07-13 MED ORDER — CHLORHEXIDINE GLUCONATE 4 % EX LIQD: 60.0000 mL | Freq: Once | CUTANEOUS | Status: DC

## 2018-07-13 MED ORDER — ONDANSETRON HCL 4 MG/2ML IJ SOLN
INTRAMUSCULAR | Status: DC | PRN
  Administered 2018-07-13: 4 mg via INTRAVENOUS

## 2018-07-13 MED ORDER — PHENYLEPHRINE HCL 10 MG/ML IJ SOLN
INTRAMUSCULAR | Status: DC | PRN
  Administered 2018-07-13 (×6): 80 ug via INTRAVENOUS

## 2018-07-13 MED ORDER — SODIUM CHLORIDE 0.9 % IR SOLN
Status: DC | PRN
  Administered 2018-07-13: 1000 mL

## 2018-07-13 MED ORDER — PHENOL 1.4 % MT LIQD: 1.0000 | OROMUCOSAL | Status: DC | PRN

## 2018-07-13 MED ORDER — DEXAMETHASONE SODIUM PHOSPHATE 10 MG/ML IJ SOLN
10.0000 mg | Freq: Once | INTRAMUSCULAR | Status: AC
  Administered 2018-07-14: 10 mg via INTRAVENOUS
  Filled 2018-07-13: qty 1

## 2018-07-13 MED ORDER — POLYETHYLENE GLYCOL 3350 17 G PO PACK: 17.0000 g | PACK | Freq: Every day | ORAL | Status: DC | PRN

## 2018-07-13 MED ORDER — METHOCARBAMOL 500 MG IVPB - SIMPLE MED
INTRAVENOUS | Status: AC
  Filled 2018-07-13: qty 50

## 2018-07-13 MED ORDER — PANTOPRAZOLE SODIUM 40 MG PO TBEC
40.0000 mg | DELAYED_RELEASE_TABLET | Freq: Every day | ORAL | Status: DC
  Administered 2018-07-13 – 2018-07-14 (×2): 40 mg via ORAL
  Filled 2018-07-13 (×2): qty 1

## 2018-07-13 MED ORDER — SODIUM CHLORIDE 0.9 % IV SOLN
INTRAVENOUS | Status: DC | PRN
  Administered 2018-07-13: 100 ug/min via INTRAVENOUS

## 2018-07-13 MED ORDER — DEXAMETHASONE SODIUM PHOSPHATE 10 MG/ML IJ SOLN: 10.0000 mg | Freq: Once | INTRAMUSCULAR | Status: DC

## 2018-07-13 MED ORDER — MORPHINE SULFATE (PF) 2 MG/ML IV SOLN
0.5000 mg | INTRAVENOUS | Status: DC | PRN
  Administered 2018-07-13: 1 mg via INTRAVENOUS
  Filled 2018-07-13: qty 1

## 2018-07-13 MED ORDER — PROPOFOL 500 MG/50ML IV EMUL
INTRAVENOUS | Status: DC | PRN
  Administered 2018-07-13: 75 ug/kg/min via INTRAVENOUS

## 2018-07-13 MED ORDER — TRANEXAMIC ACID 1000 MG/10ML IV SOLN
1000.0000 mg | INTRAVENOUS | Status: AC
  Administered 2018-07-13: 1000 mg via INTRAVENOUS
  Filled 2018-07-13: qty 10

## 2018-07-13 MED ORDER — ACETAMINOPHEN 500 MG PO TABS
500.0000 mg | ORAL_TABLET | Freq: Four times a day (QID) | ORAL | Status: AC
  Administered 2018-07-13 – 2018-07-14 (×4): 500 mg via ORAL
  Filled 2018-07-13 (×4): qty 1

## 2018-07-13 MED ORDER — CALCIUM CITRATE-VITAMIN D 315-200 MG-UNIT PO TABS: 2.0000 | ORAL_TABLET | Freq: Every day | ORAL | Status: DC

## 2018-07-13 MED ORDER — FENTANYL CITRATE (PF) 100 MCG/2ML IJ SOLN
INTRAMUSCULAR | Status: AC
  Filled 2018-07-13: qty 2

## 2018-07-13 MED ORDER — SODIUM CHLORIDE 0.9 % IV SOLN
INTRAVENOUS | Status: DC
  Administered 2018-07-13: 19:00:00 via INTRAVENOUS

## 2018-07-13 MED ORDER — BUPIVACAINE IN DEXTROSE 0.75-8.25 % IT SOLN
INTRATHECAL | Status: DC | PRN
  Administered 2018-07-13: 1.6 mL via INTRATHECAL

## 2018-07-13 MED ORDER — PROPOFOL 10 MG/ML IV BOLUS
INTRAVENOUS | Status: AC
  Filled 2018-07-13: qty 60

## 2018-07-13 MED ORDER — OXYCODONE HCL 5 MG/5ML PO SOLN
5.0000 mg | Freq: Once | ORAL | Status: DC | PRN
  Filled 2018-07-13: qty 5

## 2018-07-13 MED ORDER — ONDANSETRON HCL 4 MG/2ML IJ SOLN: 4.0000 mg | Freq: Four times a day (QID) | INTRAMUSCULAR | Status: DC | PRN

## 2018-07-13 MED ORDER — DOCUSATE SODIUM 100 MG PO CAPS
100.0000 mg | ORAL_CAPSULE | Freq: Two times a day (BID) | ORAL | Status: DC
  Administered 2018-07-13 – 2018-07-14 (×2): 100 mg via ORAL
  Filled 2018-07-13 (×2): qty 1

## 2018-07-13 MED ORDER — MENTHOL 3 MG MT LOZG: 1.0000 | LOZENGE | OROMUCOSAL | Status: DC | PRN

## 2018-07-13 MED ORDER — TRANEXAMIC ACID 1000 MG/10ML IV SOLN
1000.0000 mg | Freq: Once | INTRAVENOUS | Status: AC
  Administered 2018-07-13: 1000 mg via INTRAVENOUS
  Filled 2018-07-13: qty 1000

## 2018-07-13 MED ORDER — ONDANSETRON HCL 4 MG PO TABS: 4.0000 mg | ORAL_TABLET | Freq: Four times a day (QID) | ORAL | Status: DC | PRN

## 2018-07-13 MED ORDER — LACTATED RINGERS IV SOLN
INTRAVENOUS | Status: DC | PRN
  Administered 2018-07-13 (×2): via INTRAVENOUS

## 2018-07-13 MED ORDER — CEFAZOLIN SODIUM-DEXTROSE 2-4 GM/100ML-% IV SOLN
2.0000 g | INTRAVENOUS | Status: AC
  Administered 2018-07-13: 2000 mg via INTRAVENOUS
  Filled 2018-07-13: qty 100

## 2018-07-13 MED ORDER — ACETAMINOPHEN 325 MG PO TABS: 325.0000 mg | ORAL_TABLET | Freq: Four times a day (QID) | ORAL | Status: DC | PRN

## 2018-07-13 MED ORDER — LIDOCAINE HCL (CARDIAC) PF 100 MG/5ML IV SOSY
PREFILLED_SYRINGE | INTRAVENOUS | Status: DC | PRN
  Administered 2018-07-13: 40 mg via INTRAVENOUS

## 2018-07-13 MED ORDER — LACTATED RINGERS IV SOLN
Freq: Once | INTRAVENOUS | Status: AC
  Administered 2018-07-13: 12:00:00 via INTRAVENOUS

## 2018-07-13 MED ORDER — FENTANYL CITRATE (PF) 100 MCG/2ML IJ SOLN
50.0000 ug | INTRAMUSCULAR | Status: DC
  Administered 2018-07-13: 50 ug via INTRAVENOUS
  Administered 2018-07-13: 100 ug via INTRAVENOUS

## 2018-07-13 MED ORDER — METOCLOPRAMIDE HCL 5 MG PO TABS: 5.0000 mg | ORAL_TABLET | Freq: Three times a day (TID) | ORAL | Status: DC | PRN

## 2018-07-13 MED ORDER — PHENYLEPHRINE 40 MCG/ML (10ML) SYRINGE FOR IV PUSH (FOR BLOOD PRESSURE SUPPORT)
PREFILLED_SYRINGE | INTRAVENOUS | Status: AC
  Filled 2018-07-13: qty 20

## 2018-07-13 MED ORDER — FENTANYL CITRATE (PF) 100 MCG/2ML IJ SOLN
INTRAMUSCULAR | Status: AC
  Administered 2018-07-13: 100 ug via INTRAVENOUS
  Filled 2018-07-13: qty 2

## 2018-07-13 MED ORDER — METOCLOPRAMIDE HCL 5 MG/ML IJ SOLN: 5.0000 mg | Freq: Three times a day (TID) | INTRAMUSCULAR | Status: DC | PRN

## 2018-07-13 MED ORDER — FENTANYL CITRATE (PF) 100 MCG/2ML IJ SOLN
25.0000 ug | INTRAMUSCULAR | Status: DC | PRN
  Administered 2018-07-13 (×2): 50 ug via INTRAVENOUS

## 2018-07-13 MED ORDER — PROPOFOL 10 MG/ML IV BOLUS
INTRAVENOUS | Status: DC | PRN
  Administered 2018-07-13 (×2): 20 mg via INTRAVENOUS

## 2018-07-13 MED ORDER — FENTANYL CITRATE (PF) 100 MCG/2ML IJ SOLN
INTRAMUSCULAR | Status: DC | PRN
  Administered 2018-07-13 (×2): 50 ug via INTRAVENOUS

## 2018-07-13 SURGICAL SUPPLY — 41 items
ADH SKN CLS APL DERMABOND .7 (GAUZE/BANDAGES/DRESSINGS) ×1
BAG DECANTER FOR FLEXI CONT (MISCELLANEOUS) IMPLANT
BAG SPEC THK2 15X12 ZIP CLS (MISCELLANEOUS)
BAG ZIPLOCK 12X15 (MISCELLANEOUS) IMPLANT
BLADE SAG 18X100X1.27 (BLADE) ×3 IMPLANT
COVER PERINEAL POST (MISCELLANEOUS) ×3 IMPLANT
COVER SURGICAL LIGHT HANDLE (MISCELLANEOUS) ×3 IMPLANT
CUP ACETBLR 48 OD SECTOR II (Hips) ×2 IMPLANT
DERMABOND ADVANCED (GAUZE/BANDAGES/DRESSINGS) ×2
DERMABOND ADVANCED .7 DNX12 (GAUZE/BANDAGES/DRESSINGS) ×1 IMPLANT
DRAPE STERI IOBAN 125X83 (DRAPES) ×3 IMPLANT
DRAPE U-SHAPE 47X51 STRL (DRAPES) ×6 IMPLANT
DRESSING AQUACEL AG SP 3.5X10 (GAUZE/BANDAGES/DRESSINGS) ×1 IMPLANT
DRSG AQUACEL AG SP 3.5X10 (GAUZE/BANDAGES/DRESSINGS) ×3
DURAPREP 26ML APPLICATOR (WOUND CARE) ×3 IMPLANT
ELECT REM PT RETURN 15FT ADLT (MISCELLANEOUS) ×3 IMPLANT
ELIMINATOR HOLE APEX DEPUY (Hips) ×2 IMPLANT
GLOVE BIOGEL M STRL SZ7.5 (GLOVE) IMPLANT
GLOVE BIOGEL PI IND STRL 7.5 (GLOVE) ×1 IMPLANT
GLOVE BIOGEL PI IND STRL 8.5 (GLOVE) ×1 IMPLANT
GLOVE BIOGEL PI INDICATOR 7.5 (GLOVE) ×2
GLOVE BIOGEL PI INDICATOR 8.5 (GLOVE) ×2
GLOVE ECLIPSE 8.0 STRL XLNG CF (GLOVE) ×6 IMPLANT
GLOVE ORTHO TXT STRL SZ7.5 (GLOVE) ×3 IMPLANT
GOWN STRL REUS W/TWL 2XL LVL3 (GOWN DISPOSABLE) ×3 IMPLANT
GOWN STRL REUS W/TWL LRG LVL3 (GOWN DISPOSABLE) ×3 IMPLANT
HEAD FEM STD 32X+1 STRL (Hips) ×2 IMPLANT
HOLDER FOLEY CATH W/STRAP (MISCELLANEOUS) ×3 IMPLANT
PACK ANTERIOR HIP CUSTOM (KITS) ×3 IMPLANT
PINN ALTRX NEUT ID X OD 32X48 ×2 IMPLANT
SCREW 6.5MMX30MM (Screw) ×2 IMPLANT
STEM FEM ACTIS HIGH SZ2 (Stem) ×2 IMPLANT
SUT MNCRL AB 4-0 PS2 18 (SUTURE) ×3 IMPLANT
SUT STRATAFIX 0 PDS 27 VIOLET (SUTURE) ×3
SUT VIC AB 1 CT1 36 (SUTURE) ×9 IMPLANT
SUT VIC AB 2-0 CT1 27 (SUTURE) ×6
SUT VIC AB 2-0 CT1 TAPERPNT 27 (SUTURE) ×2 IMPLANT
SUTURE STRATFX 0 PDS 27 VIOLET (SUTURE) ×1 IMPLANT
TRAY FOLEY MTR SLVR 16FR STAT (SET/KITS/TRAYS/PACK) IMPLANT
WATER STERILE IRR 1000ML POUR (IV SOLUTION) ×3 IMPLANT
YANKAUER SUCT BULB TIP 10FT TU (MISCELLANEOUS) IMPLANT

## 2018-07-13 NOTE — Anesthesia Procedure Notes (Signed)
Spinal  Patient location during procedure: OR Start time: 07/13/2018 2:34 PM End time: 07/13/2018 2:40 PM Staffing Anesthesiologist: Oleta Mouse, MD Performed: anesthesiologist  Preanesthetic Checklist Completed: patient identified, surgical consent, pre-op evaluation, timeout performed, IV checked, risks and benefits discussed and monitors and equipment checked Spinal Block Patient position: sitting Prep: DuraPrep Patient monitoring: heart rate, cardiac monitor, continuous pulse ox and blood pressure Approach: midline Location: L3-4 Injection technique: single-shot Needle Needle type: Pencan  Needle gauge: 24 G Needle length: 9 cm Assessment Sensory level: T6

## 2018-07-13 NOTE — Op Note (Signed)
NAME:  Veronica Wells NO.: [id number]      MEDICAL RECORD NO.: [id number]      FACILITY:  [facility]      PHYSICIAN:  Mauri Pole  DATE OF BIRTH:  05/03/1936     DATE OF PROCEDURE:  07/13/2018                                 OPERATIVE REPORT         PREOPERATIVE DIAGNOSIS: Left  hip osteoarthritis.      POSTOPERATIVE DIAGNOSIS:  Left hip osteoarthritis.      PROCEDURE:  Left total hip replacement through an anterior approach   utilizing DePuy THR system, component size 57mm pinnacle cup, a size 32+4 neutral   Altrex liner, a size 2 hi Actis femoral stem with a 32+1 Articuleze metal head ball.      SURGEON:  Pietro Cassis. Alvan Dame, M.D.      ASSISTANT:  Nehemiah Massed, PA-C     ANESTHESIA:  Spinal.      SPECIMENS:  None.      COMPLICATIONS:  None.      BLOOD LOSS:  300 cc     DRAINS:  None.      INDICATION OF THE PROCEDURE:  Veronica Wells is a 82 y.o. female who had   presented to office for evaluation of left hip pain.  Radiographs revealed   progressive degenerative changes with bone-on-bone   articulation of the  hip joint, including subchondral cystic changes and osteophytes.  The patient had painful limited range of   motion significantly affecting their overall quality of life and function.  The patient was failing to    respond to conservative measures including medications and/or injections and activity modification and at this point was ready   to proceed with more definitive measures.  Consent was obtained for   benefit of pain relief.  Specific risks of infection, DVT, component   failure, dislocation, neurovascular injury, and need for revision surgery were reviewed in the office as well discussion of   the anterior versus posterior approach were reviewed.     PROCEDURE IN DETAIL:  The patient was brought to operative theater.   Once adequate anesthesia, preoperative antibiotics, 2 gm of Ancef, 1  gm of Tranexamic Acid, and 10 mg of Decadron were administered, the patient was positioned supine on the [employer] table.  Once the patient was safely positioned with adequate padding of boney prominences we predraped out the hip, and used fluoroscopy to confirm orientation of the pelvis.      The left hip was then prepped and draped from proximal iliac crest to   mid thigh with a shower curtain technique.      Time-out was performed identifying the patient, planned procedure, and the appropriate extremity.     An incision was then made 2 cm lateral to the   anterior superior iliac spine extending over the orientation of the   tensor fascia lata muscle and sharp dissection was carried down to the   fascia of the muscle.      The fascia was then incised.  The muscle belly was identified and swept   laterally and retractor placed along the superior neck.  Following   cauterization of the circumflex vessels  and removing some pericapsular   fat, a second cobra retractor was placed on the inferior neck.  A T-capsulotomy was made along the line of the   superior neck to the trochanteric fossa, then extended proximally and   distally.  Tag sutures were placed and the retractors were then placed   intracapsular.  We then identified the trochanteric fossa and   orientation of my neck cut and then made a neck osteotomy with the femur on traction.  The femoral   head was removed without difficulty or complication.  Traction was let   off and retractors were placed posterior and anterior around the   acetabulum.      The labrum and foveal tissue were debrided.  I began reaming with a 43 mm   reamer and reamed up to 47 mm reamer with good bony bed preparation and a 48 mm  cup was chosen.  The final 48 mm Pinnacle cup was then impacted under fluoroscopy to confirm the depth of penetration and orientation with respect to   Abduction and forward flexion.  A screw was placed into the ilium followed by the  hole eliminator.  The final   32+4 neutral Altrex liner was impacted with good visualized rim fit.  The cup was positioned anatomically within the acetabular portion of the pelvis.      At this point, the femur was rolled to 100 degrees.  Further capsule was   released off the inferior aspect of the femoral neck.  I then   released the superior capsule proximally.  With the leg in a neutral position the hook was placed laterally   along the femur under the vastus lateralis origin and elevated manually and then held in position using the hook attachment on the bed.  The leg was then extended and adducted with the leg rolled to 100   degrees of external rotation.  Retractors were placed along the medial calcar and posteriorly over the greater trochanter.  Once the proximal femur was fully   exposed, I used a box osteotome to set orientation.  I then began   broaching with the starting chili pepper broach and passed this by hand and then broached up to 2.  With the 2 broach in place I chose a high offset neck and did several trial reductions.  The offset was appropriate, leg lengths   appeared to be equal best matched with the +1 head ball trial confirmed radiographically.   Given these findings, I went ahead and dislocated the hip, repositioned all   retractors and positioned the right hip in the extended and abducted position.  The final 2 Hi Actis femoral stem was   chosen and it was impacted down to the level of neck cut.  Based on this   and the trial reductions, a final 32+1 Articuleze metal ball was chosen and   impacted onto a clean and dry trunnion, and the hip was reduced.  The   hip had been irrigated throughout the case again at this point.  I did   reapproximate the superior capsular leaflet to the anterior leaflet   using #1 Vicryl.  The fascia of the   tensor fascia lata muscle was then reapproximated using #1 Vicryl and #0 Stratafix sutures.  The   remaining wound was closed with  2-0 Vicryl and running 4-0 Monocryl.   The hip was cleaned, dried, and dressed sterilely using Dermabond and   Aquacel dressing.  The patient was then  brought   to recovery room in stable condition tolerating the procedure well.    Nehemiah Massed, PA-C was present for the entirety of the case involved from   preoperative positioning, perioperative retractor management, general   facilitation of the case, as well as primary wound closure as assistant.            Pietro Cassis Alvan Dame, M.D.        07/13/2018 3:53 PM

## 2018-07-13 NOTE — Interval H&P Note (Signed)
History and Physical Interval Note:  07/13/2018 1:12 PM  Veronica Wells [name]  has presented today for surgery, with the diagnosis of left hip osteoarthritis  The various methods of treatment have been discussed with the patient and family. After consideration of risks, benefits and other options for treatment, the patient has consented to  Procedure(s) with comments: LEFT TOTAL HIP ARTHROPLASTY ANTERIOR APPROACH (Left) - 32min as a surgical intervention .  The patient's history has been reviewed, patient examined, no change in status, stable for surgery.  I have reviewed the patient's chart and labs.  Questions were answered to the patient's satisfaction.     Mauri Pole

## 2018-07-13 NOTE — Transfer of Care (Signed)
Immediate Anesthesia Transfer of Care Note  Patient: Synai Prettyman [name]  Procedure(s) Performed: LEFT TOTAL HIP ARTHROPLASTY ANTERIOR APPROACH (Left Hip)  Patient Location: PACU  Anesthesia Type:Spinal  Level of Consciousness: drowsy and responds to stimulation  Airway & Oxygen Therapy: Patient Spontanous Breathing and Patient connected to face mask oxygen  Post-op Assessment: Report given to RN and Post -op Vital signs reviewed and stable  Post vital signs: Reviewed and stable  Last Vitals:  Vitals Value Taken Time  BP 101/47 07/13/2018  4:36 PM  Temp    Pulse 55 07/13/2018  4:39 PM  Resp 12 07/13/2018  4:39 PM  SpO2 100 % 07/13/2018  4:39 PM  Vitals shown include unvalidated device data.  Last Pain:  Vitals:   07/13/18 1204  TempSrc:   PainSc: 10-Worst pain ever         Complications: No apparent anesthesia complications

## 2018-07-13 NOTE — Anesthesia Preprocedure Evaluation (Signed)
Anesthesia Evaluation  Patient identified by MRN, date of birth, ID band Patient awake    Reviewed: Allergy & Precautions, NPO status , Patient's Chart, lab work & pertinent test results  History of Anesthesia Complications Negative for: history of anesthetic complications  Airway Mallampati: II  TM Distance: >3 FB Neck ROM: Full    Dental  (+) Teeth Intact   Pulmonary neg shortness of breath, neg sleep apnea, neg COPD, neg recent URI, former smoker,    breath sounds clear to auscultation       Cardiovascular negative cardio ROS   Rhythm:Regular     Neuro/Psych  Headaches, neg Seizures    GI/Hepatic negative GI ROS, Neg liver ROS,   Endo/Other  negative endocrine ROS  Renal/GU negative Renal ROS     Musculoskeletal  (+) Arthritis ,   Abdominal   Peds  Hematology negative hematology ROS (+)   Anesthesia Other Findings   Reproductive/Obstetrics                             Anesthesia Physical Anesthesia Plan  ASA: I  Anesthesia Plan: MAC and Spinal   Post-op Pain Management:    Induction:   PONV Risk Score and Plan: 2 and Treatment may vary due to age or medical condition  Airway Management Planned: Nasal Cannula  Additional Equipment: None  Intra-op Plan:   Post-operative Plan:   Informed Consent: I have reviewed the patients History and Physical, chart, labs and discussed the procedure including the risks, benefits and alternatives for the proposed anesthesia with the patient or authorized representative who has indicated his/her understanding and acceptance.   Dental advisory given  Plan Discussed with: CRNA and Surgeon  Anesthesia Plan Comments:         Anesthesia Quick Evaluation

## 2018-07-14 ENCOUNTER — Other Ambulatory Visit: Payer: Self-pay | Admitting: Orthopedic Surgery

## 2018-07-14 LAB — BASIC METABOLIC PANEL
Anion gap: 8 (ref 5–15)
BUN: 29 mg/dL — AB (ref 8–23)
CALCIUM: 8.4 mg/dL — AB (ref 8.9–10.3)
CO2: 24 mmol/L (ref 22–32)
CREATININE: 0.84 mg/dL (ref 0.44–1.00)
Chloride: 106 mmol/L (ref 98–111)
GFR calc Af Amer: >60 mL/min (ref >60)
GFR calc non Af Amer: >60 mL/min (ref >60)
GLUCOSE: 146 mg/dL — AB (ref 70–99)
Potassium: 4.2 mmol/L (ref 3.5–5.1)
Sodium: 138 mmol/L (ref 135–145)

## 2018-07-14 LAB — CBC
HEMATOCRIT: 32.8 % — AB (ref 36.0–46.0)
Hemoglobin: 10.9 g/dL — ABNORMAL LOW (ref 12.0–15.0)
MCH: 30.8 pg (ref 26.0–34.0)
MCHC: 33.2 g/dL (ref 30.0–36.0)
MCV: 92.7 fL (ref 78.0–100.0)
Platelets: 259 10*3/uL (ref 150–400)
RBC: 3.54 MIL/uL — ABNORMAL LOW (ref 3.87–5.11)
RDW: 13.3 % (ref 11.5–15.5)
WBC: 8.4 10*3/uL (ref 4.0–10.5)

## 2018-07-14 MED ORDER — ASPIRIN 81 MG PO CHEW: 81.0000 mg | CHEWABLE_TABLET | Freq: Two times a day (BID) | ORAL | 0 refills | Status: DC

## 2018-07-14 MED ORDER — HYDROCODONE-ACETAMINOPHEN 5-325 MG PO TABS: 1.0000 | ORAL_TABLET | Freq: Four times a day (QID) | ORAL | 0 refills | Status: DC | PRN

## 2018-07-14 MED ORDER — METHOCARBAMOL 500 MG PO TABS: 500.0000 mg | ORAL_TABLET | Freq: Four times a day (QID) | ORAL | 0 refills | Status: DC | PRN

## 2018-07-14 NOTE — Progress Notes (Signed)
Physical Therapy Treatment Patient Details Name: Veronica Wells MRN: [id number] DOB: [birth year] Today's Date: 07/14/2018    History of Present Illness Pt s/p L THR  and with hx of R THR and R TKR.    PT Comments    Pt cooperative but pain limited this session.  Pt and spouse reviewed car transfers, shower transfers and home therex program with progression and written instruction provided.   Follow Up Recommendations  Follow surgeon's recommendation for DC plan and follow-up therapies     Equipment Recommendations  None recommended by PT    Recommendations for Other Services       Precautions / Restrictions Precautions Precautions: Fall Restrictions Weight Bearing Restrictions: No    Mobility  Bed Mobility                  Transfers                    Ambulation/Gait                 Stairs             Wheelchair Mobility    Modified Rankin (Stroke Patients Only)       Balance                                            Cognition Arousal/Alertness: Awake/alert Behavior During Therapy: WFL for tasks assessed/performed Overall Cognitive Status: Within Functional Limits for tasks assessed                                        Exercises Total Joint Exercises Ankle Circles/Pumps: AROM;Both;15 reps;Supine Quad Sets: AROM;Both;10 reps;Supine Heel Slides: AAROM;Left;20 reps;Supine Hip ABduction/ADduction: AAROM;Left;15 reps;Supine    General Comments        Pertinent Vitals/Pain Pain Assessment: 0-10 Pain Score: 6  Pain Location: L hip Pain Descriptors / Indicators: Aching;Spasm Pain Intervention(s): Limited activity within patient's tolerance;Monitored during session;Premedicated before session;Ice applied    Home Living                      Prior Function            PT Goals (current goals can now be found in the care plan section) Acute Rehab PT  Goals Patient Stated Goal: regain IND PT Goal Formulation: With patient Time For Goal Achievement: 07/21/18 Potential to Achieve Goals: Good Progress towards PT goals: Progressing toward goals    Frequency    7X/week      PT Plan Current plan remains appropriate    Co-evaluation              AM-PAC PT "6 Clicks" Daily Activity  Outcome Measure  Difficulty turning over in bed (including adjusting bedclothes, sheets and blankets)?: Unable Difficulty moving from lying on back to sitting on the side of the bed? : Unable Difficulty sitting down on and standing up from a chair with arms (e.g., wheelchair, bedside commode, etc,.)?: Unable Help needed moving to and from a bed to chair (including a wheelchair)?: A Little Help needed walking in hospital room?: A Little Help needed climbing 3-5 steps with a railing? : A Little 6 Click Score: 12    End of Session Equipment Utilized During  Treatment: Gait belt Activity Tolerance: Patient tolerated treatment well Patient left: in chair;with call bell/phone within reach Nurse Communication: Mobility status PT Visit Diagnosis: Difficulty in walking, not elsewhere classified (R26.2)     Time: 0488-8916 PT Time Calculation (min) (ACUTE ONLY): 29 min  Charges:  $Therapeutic Exercise: 8-22 mins $Therapeutic Activity: 8-22 mins                     Debe Coder PT Acute Rehabilitation Services Pager [phone] Office [phone]    Veronica Wells 07/14/2018, 3:53 PM

## 2018-07-14 NOTE — Discharge Instructions (Signed)
Dr. Paralee Cancel Total Joint Specialist [facility] [address] [place], [place] [address] [phone]  ANTERIOR APPROACH TOTAL HIP REPLACEMENT POSTOPERATIVE DIRECTIONS   Hip Rehabilitation, Guidelines Following Surgery  The results of a hip operation are greatly improved after range of motion and muscle strengthening exercises. Follow all safety measures which are given to protect your hip. If any of these exercises cause increased pain or swelling in your joint, decrease the amount until you are comfortable again. Then slowly increase the exercises. Call your caregiver if you have problems or questions.   HOME CARE INSTRUCTIONS  Remove items at home which could result in a fall. This includes throw rugs or furniture in walking pathways.   ICE to the affected hip every three hours for 30 minutes at a time and then as needed for pain and swelling.  Continue to use ice on the hip for pain and swelling from surgery. You may notice swelling that will progress down to the foot and ankle.  This is normal after surgery.  Elevate the leg when you are not up walking on it.    Continue to use the breathing machine which will help keep your temperature down.  It is common for your temperature to cycle up and down following surgery, especially at night when you are not up moving around and exerting yourself.  The breathing machine keeps your lungs expanded and your temperature down.   DIET You may resume your previous home diet once your are discharged from the hospital.  DRESSING / WOUND CARE / SHOWERING Keep the surgical dressing until follow up.  The dressing is water proof, so you can shower without any extra covering.  IF THE DRESSING FALLS OFF or the wound gets wet inside, change the dressing with sterile gauze.  Please use good hand washing techniques before changing the dressing.  Do not use any lotions or creams on the incision until instructed by your surgeon.   You may  start showering once you are discharged home but do not submerge the incision under water. Just pat the incision dry and apply a dry gauze dressing on daily. Change the surgical dressing daily and reapply a dry dressing each time.  ACTIVITY Walk with your walker as instructed. Use walker as long as suggested by your caregivers. Avoid periods of inactivity such as sitting longer than an hour when not asleep. This helps prevent blood clots.  You may resume a sexual relationship in one month or when given the OK by your doctor.  You may return to work once you are cleared by your doctor.  Do not drive a car for 6 weeks or until released by you surgeon.  Do not drive while taking narcotics.  WEIGHT BEARING Weight bearing as tolerated with assist device (walker, cane, etc) as directed, use it as long as suggested by your surgeon or therapist, typically at least 4-6 weeks.  POSTOPERATIVE CONSTIPATION PROTOCOL Constipation - defined medically as fewer than three stools per week and severe constipation as less than one stool per week.  One of the most common issues patients have following surgery is constipation.  Even if you have a regular bowel pattern at home, your normal regimen is likely to be disrupted due to multiple reasons following surgery.  Combination of anesthesia, postoperative narcotics, change in appetite and fluid intake all can affect your bowels.  In order to avoid complications following surgery, here are some recommendations in order to help you during  your recovery period.  Colace (docusate) - Pick up an over-the-counter form of Colace or another stool softener and take twice a day as long as you are requiring postoperative pain medications.  Take with a full glass of water daily.  If you experience loose stools or diarrhea, hold the colace until you stool forms back up.  If your symptoms do not get better within 1 week or if they get worse, check with your doctor.  Dulcolax  (bisacodyl) - Pick up over-the-counter and take as directed by the product packaging as needed to assist with the movement of your bowels.  Take with a full glass of water.  Use this product as needed if not relieved by Colace only.   MiraLax (polyethylene glycol) - Pick up over-the-counter to have on hand.  MiraLax is a solution that will increase the amount of water in your bowels to assist with bowel movements.  Take as directed and can mix with a glass of water, juice, soda, coffee, or tea.  Take if you go more than two days without a movement. Do not use MiraLax more than once per day. Call your doctor if you are still constipated or irregular after using this medication for 7 days in a row.  If you continue to have problems with postoperative constipation, please contact the office for further assistance and recommendations.  If you experience "the worst abdominal pain ever" or develop nausea or vomiting, please contact the office immediatly for further recommendations for treatment.  ITCHING  If you experience itching with your medications, try taking only a single pain pill, or even half a pain pill at a time.  You can also use Benadryl over the counter for itching or also to help with sleep.   TED HOSE STOCKINGS Wear the elastic stockings on both legs for three weeks following surgery during the day but you may remove then at night for sleeping.  MEDICATIONS See your medication summary on the After Visit Summary that the nursing staff will review with you prior to discharge.  You may have some home medications which will be placed on hold until you complete the course of blood thinner medication.  It is important for you to complete the blood thinner medication as prescribed by your surgeon.  Continue your approved medications as instructed at time of discharge.  PRECAUTIONS If you experience chest pain or shortness of breath - call 911 immediately for transfer to the hospital emergency  department.  If you develop a fever greater that 101 F, purulent drainage from wound, increased redness or drainage from wound, foul odor from the wound/dressing, or calf pain - CONTACT YOUR SURGEON.                                                   FOLLOW-UP APPOINTMENTS Make sure you keep all of your appointments after your operation with your surgeon and caregivers. You should call the office at the above phone number and make an appointment for approximately two weeks after the date of your surgery or on the date instructed by your surgeon outlined in the "After Visit Summary".  RANGE OF MOTION AND STRENGTHENING EXERCISES  These exercises are designed to help you keep full movement of your hip joint. Follow your caregiver's or physical therapist's instructions. Perform all exercises about fifteen times, three times  per day or as directed. Exercise both hips, even if you have had only one joint replacement. These exercises can be done on a training (exercise) mat, on the floor, on a table or on a bed. Use whatever works the best and is most comfortable for you. Use music or television while you are exercising so that the exercises are a pleasant break in your day. This will make your life better with the exercises acting as a break in routine you can look forward to.  Lying on your back, slowly slide your foot toward your buttocks, raising your knee up off the floor. Then slowly slide your foot back down until your leg is straight again.  Lying on your back spread your legs as far apart as you can without causing discomfort.  Lying on your side, raise your upper leg and foot straight up from the floor as far as is comfortable. Slowly lower the leg and repeat.  Lying on your back, tighten up the muscle in the front of your thigh (quadriceps muscles). You can do this by keeping your leg straight and trying to raise your heel off the floor. This helps strengthen the largest muscle supporting your knee.   Lying on your back, tighten up the muscles of your buttocks both with the legs straight and with the knee bent at a comfortable angle while keeping your heel on the floor.   IF YOU ARE TRANSFERRED TO A SKILLED REHAB FACILITY If the patient is transferred to a skilled rehab facility following release from the hospital, a list of the current medications will be sent to the facility for the patient to continue.  When discharged from the skilled rehab facility, please have the facility set up the patient's [place] prior to being released. Also, the skilled facility will be responsible for providing the patient with their medications at time of release from the facility to include their pain medication, the muscle relaxants, and their blood thinner medication. If the patient is still at the rehab facility at time of the two week follow up appointment, the skilled rehab facility will also need to assist the patient in arranging follow up appointment in our office and any transportation needs.  MAKE SURE YOU:  Understand these instructions.  Get help right away if you are not doing well or get worse.    Pick up stool softner and laxative for home use following surgery while on pain medications. Do not submerge incision under water. Please use good hand washing techniques while changing dressing each day. May shower starting three days after surgery. Please use a clean towel to pat the incision dry following showers. Continue to use ice for pain and swelling after surgery. Do not use any lotions or creams on the incision until instructed by your surgeon.

## 2018-07-14 NOTE — Discharge Summary (Signed)
Physician Discharge Summary   Patient ID: Veronica Wells MRN: [id number] DOB/AGE: 80-Aug-1937 80 y.o.  Admit date: 07/13/2018 Discharge date: 07/14/2018  Primary Diagnosis: Primary osteoarthritis left hip  Admission Diagnoses:  Past Medical History:  Diagnosis Date  . Arthritis   . Cancer ([place]) 2003   melanoma on back right arm  . Difficulty sleeping   . Environmental allergies   . Headaches, cluster   . Hemorrhoids   . Osteopenia    Discharge Diagnoses:   Active Problems:   Status post total replacement of left hip  Estimated body mass index is 20.69 kg/m as calculated from the following:   Height as of this encounter: _0  (1.473 m).   Weight as of this encounter: 44.9 kg.  Procedure(s) (LRB): LEFT TOTAL HIP ARTHROPLASTY ANTERIOR APPROACH (Left)   Consults: None  HPI: Veronica Wells, 82 y.o. female, has a history of pain and functional disability in the left hip due to arthritis and patient has failed non-surgical conservative treatments for greater than 12 weeks to include NSAID's and/or analgesics, corticosteriod injections and activity modification.  Onset of symptoms was gradual starting ~1 years ago with gradually worsening course since that time.The patient noted prior procedures of the hip to include arthroplasty on the right hip(s).  Patient currently rates pain in the left hip at 8 out of 10 with activity. Patient has night pain, worsening of pain with activity and weight bearing, trendelenberg gait, pain that interfers with activities of daily living and pain with passive range of motion. Patient has evidence of periarticular osteophytes and joint space narrowing by imaging studies. This condition presents safety issues increasing the risk of falls. There is no current active infection.  Risks, benefits and expectations were discussed with the patient.  Risks including but not limited to the risk of anesthesia, blood clots, nerve damage, blood  vessel damage, failure of the prosthesis, infection and up to and including death.  Patient understand the risks, benefits and expectations and wishes to proceed with surgery.   Laboratory Data: Admission on 07/13/2018, Discharged on 07/14/2018  Component Date Value Ref Range Status  . WBC 07/14/2018 8.4  4.0 - 10.5 K/uL Final  . RBC 07/14/2018 3.54* 3.87 - 5.11 MIL/uL Final  . Hemoglobin 07/14/2018 10.9* 12.0 - 15.0 g/dL Final  . HCT 07/14/2018 32.8* 36.0 - 46.0 % Final  . MCV 07/14/2018 92.7  78.0 - 100.0 fL Final  . MCH 07/14/2018 30.8  26.0 - 34.0 pg Final  . MCHC 07/14/2018 33.2  30.0 - 36.0 g/dL Final  . RDW 07/14/2018 13.3  11.5 - 15.5 % Final  . Platelets 07/14/2018 259  150 - 400 K/uL Final   Performed at [facility], [place] [address] [place], [place] [address]  . Sodium 07/14/2018 138  135 - 145 mmol/L Final  . Potassium 07/14/2018 4.2  3.5 - 5.1 mmol/L Final  . Chloride 07/14/2018 106  98 - 111 mmol/L Final  . CO2 07/14/2018 24  22 - 32 mmol/L Final  . Glucose, Bld 07/14/2018 146* 70 - 99 mg/dL Final  . BUN 07/14/2018 29* 8 - 23 mg/dL Final  . Creatinine, Ser 07/14/2018 0.84  0.44 - 1.00 mg/dL Final  . Calcium 07/14/2018 8.4* 8.9 - 10.3 mg/dL Final  . GFR calc non Af Amer 07/14/2018 >60  >60 mL/min Final  . GFR calc Af Amer 07/14/2018 >60  >60 mL/min Final   Comment: (NOTE) The eGFR has been calculated  using the CKD EPI equation. This calculation has not been validated in all clinical situations. eGFR's persistently <60 mL/min signify possible Chronic Kidney Disease.   Georgiann Hahn gap 07/14/2018 8  5 - 15 Final   Performed at [facility], [place] [address] [place], [place] [address]  Hospital Outpatient Visit on 07/07/2018  Component Date Value Ref Range Status  . MRSA, PCR 07/07/2018 NEGATIVE  NEGATIVE Final  . Staphylococcus aureus 07/07/2018 NEGATIVE  NEGATIVE Final   Comment: (NOTE) The Xpert SA Assay (FDA approved for NASAL  specimens in patients 68 years of age and older), is one component of a comprehensive surveillance program. It is not intended to diagnose infection nor to guide or monitor treatment. Performed at [facility], [place] [address] [place], [place] [address]   . ABO/RH(D) 07/07/2018 O POS   Final  . Antibody Screen 07/07/2018 NEG   Final  . Sample Expiration 07/07/2018 07/16/2018   Final  . Extend sample reason 07/07/2018    Final                   Value:NO TRANSFUSIONS OR PREGNANCY IN THE PAST 3 MONTHS Performed at [facility], [place] [address], [place], [place] [address]   . Sodium 07/07/2018 142  135 - 145 mmol/L Final  . Potassium 07/07/2018 4.5  3.5 - 5.1 mmol/L Final  . Chloride 07/07/2018 106  98 - 111 mmol/L Final  . CO2 07/07/2018 29  22 - 32 mmol/L Final  . Glucose, Bld 07/07/2018 81  70 - 99 mg/dL Final  . BUN 07/07/2018 36* 8 - 23 mg/dL Final  . Creatinine, Ser 07/07/2018 0.97  0.44 - 1.00 mg/dL Final  . Calcium 07/07/2018 9.3  8.9 - 10.3 mg/dL Final  . GFR calc non Af Amer 07/07/2018 54* >60 mL/min Final  . GFR calc Af Amer 07/07/2018 >60  >60 mL/min Final   Comment: (NOTE) The eGFR has been calculated using the CKD EPI equation. This calculation has not been validated in all clinical situations. eGFR's persistently <60 mL/min signify possible Chronic Kidney Disease.   Georgiann Hahn gap 07/07/2018 7  5 - 15 Final   Performed at [facility], [place] [address] [place], [place] [address]  Office Visit on 07/05/2018  Component Date Value Ref Range Status  . Color, Urine 07/05/2018 YELLOW  Yellow;Lt. Yellow Final  . APPearance 07/05/2018 CLEAR  Clear Final  . Specific Gravity, Urine 07/05/2018 1.025  1.000 - 1.030 Final  . pH 07/05/2018 5.5  5.0 - 8.0 Final  . Total Protein, Urine 07/05/2018 NEGATIVE  Negative Final  . Urine Glucose 07/05/2018 NEGATIVE  Negative Final  . Ketones, ur 07/05/2018 NEGATIVE  Negative Final  .  Bilirubin Urine 07/05/2018 NEGATIVE  Negative Final  . Hgb urine dipstick 07/05/2018 NEGATIVE  Negative Final  . Urobilinogen, UA 07/05/2018 0.2  0.0 - 1.0 Final  . Leukocytes, UA 07/05/2018 NEGATIVE  Negative Final  . Nitrite 07/05/2018 NEGATIVE  Negative Final  . Cholesterol 07/05/2018 237* 0 - 200 mg/dL Final   ATP III Classification       Desirable:  < 200 mg/dL               Borderline High:  200 - 239 mg/dL          High:  > = 240 mg/dL  . Triglycerides 07/05/2018 134.0  0.0 - 149.0 mg/dL Final   Normal:  <150 mg/dLBorderline High:  150 -  199 mg/dL  . HDL 07/05/2018 67.60  >39.00 mg/dL Final  . VLDL 07/05/2018 26.8  0.0 - 40.0 mg/dL Final  . LDL Cholesterol 07/05/2018 143* 0 - 99 mg/dL Final  . Total CHOL/HDL Ratio 07/05/2018 4   Final                  Men          Women1/2 Average Risk     3.4          3.3Average Risk          5.0          4.42X Average Risk          9.6          7.13X Average Risk          15.0          11.0                      . NonHDL 07/05/2018 169.55   Final   NOTE:  Non-HDL goal should be 30 mg/dL higher than patient's LDL goal (i.e. LDL goal of < 70 mg/dL, would have non-HDL goal of < 100 mg/dL)  . Sodium 07/05/2018 140  135 - 145 mEq/L Final  . Potassium 07/05/2018 4.6  3.5 - 5.1 mEq/L Final  . Chloride 07/05/2018 104  96 - 112 mEq/L Final  . CO2 07/05/2018 30  19 - 32 mEq/L Final  . Glucose, Bld 07/05/2018 105* 70 - 99 mg/dL Final  . BUN 07/05/2018 32* 6 - 23 mg/dL Final  . Creatinine, Ser 07/05/2018 0.92  0.40 - 1.20 mg/dL Final  . Total Bilirubin 07/05/2018 0.3  0.2 - 1.2 mg/dL Final  . Alkaline Phosphatase 07/05/2018 70  39 - 117 U/L Final  . AST 07/05/2018 14  0 - 37 U/L Final  . ALT 07/05/2018 11  0 - 35 U/L Final  . Total Protein 07/05/2018 6.7  6.0 - 8.3 g/dL Final  . Albumin 07/05/2018 3.9  3.5 - 5.2 g/dL Final  . Calcium 07/05/2018 9.6  8.4 - 10.5 mg/dL Final  . GFR 07/05/2018 62.45  >60.00 mL/min Final  . WBC 07/05/2018 4.5  4.0 - 10.5 K/uL  Final  . RBC 07/05/2018 4.40  3.87 - 5.11 Mil/uL Final  . Hemoglobin 07/05/2018 13.7  12.0 - 15.0 g/dL Final  . HCT 07/05/2018 40.5  36.0 - 46.0 % Final  . MCV 07/05/2018 92.2  78.0 - 100.0 fl Final  . MCHC 07/05/2018 33.8  30.0 - 36.0 g/dL Final  . RDW 07/05/2018 14.0  11.5 - 15.5 % Final  . Platelets 07/05/2018 246.0  150.0 - 400.0 K/uL Final  . Neutrophils Relative % 07/05/2018 60.9  43.0 - 77.0 % Final  . Lymphocytes Relative 07/05/2018 25.1  12.0 - 46.0 % Final  . Monocytes Relative 07/05/2018 9.0  3.0 - 12.0 % Final  . Eosinophils Relative 07/05/2018 4.5  0.0 - 5.0 % Final  . Basophils Relative 07/05/2018 0.5  0.0 - 3.0 % Final  . Neutro Abs 07/05/2018 2.8  1.4 - 7.7 K/uL Final  . Lymphs Abs 07/05/2018 1.1  0.7 - 4.0 K/uL Final  . Monocytes Absolute 07/05/2018 0.4  0.1 - 1.0 K/uL Final  . Eosinophils Absolute 07/05/2018 0.2  0.0 - 0.7 K/uL Final  . Basophils Absolute 07/05/2018 0.0  0.0 - 0.1 K/uL Final     X-Rays:Dg Pelvis Portable  Result Date: 07/13/2018 CLINICAL DATA:  82 year old female post left total hip replacement. Subsequent encounter. EXAM: PORTABLE PELVIS 1-2 VIEWS COMPARISON:  Intraoperative C-arm views 07/13/2018. FINDINGS: Post recent left total hip replacement which appears in satisfactory position without complication noted on frontal view. Remote right hip replacement. IMPRESSION: Post recent total left hip replacement and remote right total hip replacement. Electronically Signed   By: Genia Del M.D.   On: 07/13/2018 17:05   Dg C-arm 1-60 Min-no Report  Result Date: 07/13/2018 Fluoroscopy was utilized by the requesting physician.  No radiographic interpretation.    EKG: Orders placed or performed in visit on 07/05/18  . EKG 12-Lead     Hospital Course: Patient was admitted to [facility] and taken to the OR and underwent the above state procedure without complications.  Patient tolerated the procedure well and was later transferred to the  recovery room and then to the orthopaedic floor for postoperative care.  They were given PO and IV analgesics for pain control following their surgery.  They were given 24 hours of postoperative antibiotics of  Anti-infectives (From admission, onward)   Start     Dose/Rate Route Frequency Ordered Stop   07/13/18 2100  ceFAZolin (ANCEF) IVPB 1 g/50 mL premix     1 g 100 mL/hr over 30 Minutes Intravenous Every 6 hours 07/13/18 1817 07/14/18 0316   07/13/18 1200  ceFAZolin (ANCEF) IVPB 2g/100 mL premix     2 g 200 mL/hr over 30 Minutes Intravenous On call to O.R. 07/13/18 1151 07/13/18 1457     and started on DVT prophylaxis in the form of Aspirin.   PT and OT were ordered for total hip protocol.  The patient was allowed to be WBAT with therapy. Discharge planning was consulted to help with postop disposition and equipment needs.  Patient had a fair night on the evening of surgery.  They started to get up OOB with therapy on day one.  The patient had progressed with therapy and meeting their goals.  Incision was healing well.  Patient was seen in rounds and was ready to go home.  Diet: Cardiac diet Activity:WBAT Follow-up:in 2 weeks Disposition - Home Discharged Condition: stable   Discharge Instructions    Call MD / Call 911   Complete by:  As directed    If you experience chest pain or shortness of breath, CALL 911 and be transported to the hospital emergency room.  If you develope a fever above 101 F, pus (white drainage) or increased drainage or redness at the wound, or calf pain, call your surgeon's office.   Constipation Prevention   Complete by:  As directed    Drink plenty of fluids.  Prune juice may be helpful.  You may use a stool softener, such as Colace (over the counter) 100 mg twice a day.  Use MiraLax (over the counter) for constipation as needed.   Diet - low sodium heart healthy   Complete by:  As directed    Discharge instructions   Complete by:  As directed    Dr.  Paralee Cancel Total Joint Specialist [facility] [address], [place], [place] [address] [phone]  ANTERIOR APPROACH TOTAL HIP REPLACEMENT POSTOPERATIVE DIRECTIONS   Hip Rehabilitation, Guidelines Following Surgery  The results of a hip operation are greatly improved after range of motion and muscle strengthening exercises. Follow all safety measures which are given to protect your hip. If any of these exercises cause increased pain or swelling in your joint, decrease the  amount until you are comfortable again. Then slowly increase the exercises. Call your caregiver if you have problems or questions.   HOME CARE INSTRUCTIONS  Remove items at home which could result in a fall. This includes throw rugs or furniture in walking pathways.  ICE to the affected hip every three hours for 30 minutes at a time and then as needed for pain and swelling.  Continue to use ice on the hip for pain and swelling from surgery. You may notice swelling that will progress down to the foot and ankle.  This is normal after surgery.  Elevate the leg when you are not up walking on it.   Continue to use the breathing machine which will help keep your temperature down.  It is common for your temperature to cycle up and down following surgery, especially at night when you are not up moving around and exerting yourself.  The breathing machine keeps your lungs expanded and your temperature down.   DIET You may resume your previous home diet once your are discharged from the hospital.  DRESSING / WOUND CARE / SHOWERING Keep the surgical dressing until follow up.  The dressing is water proof, so you can shower without any extra covering.  IF THE DRESSING FALLS OFF or the wound gets wet inside, change the dressing with sterile gauze.  Please use good hand washing techniques before changing the dressing.  Do not use any lotions or creams on the incision until instructed by your surgeon.   You may start  showering once you are discharged home but do not submerge the incision under water. Just pat the incision dry and apply a dry gauze dressing on daily. Change the surgical dressing daily and reapply a dry dressing each time.  ACTIVITY Walk with your walker as instructed. Use walker as long as suggested by your caregivers. Avoid periods of inactivity such as sitting longer than an hour when not asleep. This helps prevent blood clots.  You may resume a sexual relationship in one month or when given the OK by your doctor.  You may return to work once you are cleared by your doctor.  Do not drive a car for 6 weeks or until released by you surgeon.  Do not drive while taking narcotics.  WEIGHT BEARING Weight bearing as tolerated with assist device (walker, cane, etc) as directed, use it as long as suggested by your surgeon or therapist, typically at least 4-6 weeks.  POSTOPERATIVE CONSTIPATION PROTOCOL Constipation - defined medically as fewer than three stools per week and severe constipation as less than one stool per week.  One of the most common issues patients have following surgery is constipation.  Even if you have a regular bowel pattern at home, your normal regimen is likely to be disrupted due to multiple reasons following surgery.  Combination of anesthesia, postoperative narcotics, change in appetite and fluid intake all can affect your bowels.  In order to avoid complications following surgery, here are some recommendations in order to help you during your recovery period.  Colace (docusate) - Pick up an over-the-counter form of Colace or another stool softener and take twice a day as long as you are requiring postoperative pain medications.  Take with a full glass of water daily.  If you experience loose stools or diarrhea, hold the colace until you stool forms back up.  If your symptoms do not get better within 1 week or if they get worse, check with your doctor.  Dulcolax (  bisacodyl)  - Pick up over-the-counter and take as directed by the product packaging as needed to assist with the movement of your bowels.  Take with a full glass of water.  Use this product as needed if not relieved by Colace only.   MiraLax (polyethylene glycol) - Pick up over-the-counter to have on hand.  MiraLax is a solution that will increase the amount of water in your bowels to assist with bowel movements.  Take as directed and can mix with a glass of water, juice, soda, coffee, or tea.  Take if you go more than two days without a movement. Do not use MiraLax more than once per day. Call your doctor if you are still constipated or irregular after using this medication for 7 days in a row.  If you continue to have problems with postoperative constipation, please contact the office for further assistance and recommendations.  If you experience "the worst abdominal pain ever" or develop nausea or vomiting, please contact the office immediatly for further recommendations for treatment.  ITCHING  If you experience itching with your medications, try taking only a single pain pill, or even half a pain pill at a time.  You can also use Benadryl over the counter for itching or also to help with sleep.   TED HOSE STOCKINGS Wear the elastic stockings on both legs for three weeks following surgery during the day but you may remove then at night for sleeping.  MEDICATIONS See your medication summary on the "After Visit Summary" that the nursing staff will review with you prior to discharge.  You may have some home medications which will be placed on hold until you complete the course of blood thinner medication.  It is important for you to complete the blood thinner medication as prescribed by your surgeon.  Continue your approved medications as instructed at time of discharge.  PRECAUTIONS If you experience chest pain or shortness of breath - call 911 immediately for transfer to the hospital emergency department.    If you develop a fever greater that 101 F, purulent drainage from wound, increased redness or drainage from wound, foul odor from the wound/dressing, or calf pain - CONTACT YOUR SURGEON.                                                   FOLLOW-UP APPOINTMENTS Make sure you keep all of your appointments after your operation with your surgeon and caregivers. You should call the office at the above phone number and make an appointment for approximately two weeks after the date of your surgery or on the date instructed by your surgeon outlined in the "After Visit Summary".  RANGE OF MOTION AND STRENGTHENING EXERCISES  These exercises are designed to help you keep full movement of your hip joint. Follow your caregiver's or physical therapist's instructions. Perform all exercises about fifteen times, three times per day or as directed. Exercise both hips, even if you have had only one joint replacement. These exercises can be done on a training (exercise) mat, on the floor, on a table or on a bed. Use whatever works the best and is most comfortable for you. Use music or television while you are exercising so that the exercises are a pleasant break in your day. This will make your life better with the exercises acting as a  break in routine you can look forward to.  Lying on your back, slowly slide your foot toward your buttocks, raising your knee up off the floor. Then slowly slide your foot back down until your leg is straight again.  Lying on your back spread your legs as far apart as you can without causing discomfort.  Lying on your side, raise your upper leg and foot straight up from the floor as far as is comfortable. Slowly lower the leg and repeat.  Lying on your back, tighten up the muscle in the front of your thigh (quadriceps muscles). You can do this by keeping your leg straight and trying to raise your heel off the floor. This helps strengthen the largest muscle supporting your knee.  Lying on  your back, tighten up the muscles of your buttocks both with the legs straight and with the knee bent at a comfortable angle while keeping your heel on the floor.   IF YOU ARE TRANSFERRED TO A SKILLED REHAB FACILITY If the patient is transferred to a skilled rehab facility following release from the hospital, a list of the current medications will be sent to the facility for the patient to continue.  When discharged from the skilled rehab facility, please have the facility set up the patient's [place] prior to being released. Also, the skilled facility will be responsible for providing the patient with their medications at time of release from the facility to include their pain medication, the muscle relaxants, and their blood thinner medication. If the patient is still at the rehab facility at time of the two week follow up appointment, the skilled rehab facility will also need to assist the patient in arranging follow up appointment in our office and any transportation needs.  MAKE SURE YOU:  Understand these instructions.  Get help right away if you are not doing well or get worse.    Pick up stool softner and laxative for home use following surgery while on pain medications. Do not submerge incision under water. Please use good hand washing techniques while changing dressing each day. May shower starting three days after surgery. Please use a clean towel to pat the incision dry following showers. Continue to use ice for pain and swelling after surgery. Do not use any lotions or creams on the incision until instructed by your surgeon.   Increase activity slowly as tolerated   Complete by:  As directed      Allergies as of 07/14/2018   No Known Allergies     Medication List    STOP taking these medications   aspirin EC 81 MG tablet Replaced by:  aspirin 81 MG chewable tablet     TAKE these medications   aspirin 81 MG chewable tablet Chew 1 tablet (81 mg total)  by mouth 2 (two) times daily. Replaces:  aspirin EC 81 MG tablet   CALCIUM + D PO Take 1 tablet by mouth daily.   HYDROcodone-acetaminophen 5-325 MG tablet Commonly known as:  NORCO/VICODIN Take 1-2 tablets by mouth every 6 (six) hours as needed for moderate pain (pain score 4-6).   methocarbamol 500 MG tablet Commonly known as:  ROBAXIN Take 1 tablet (500 mg total) by mouth every 6 (six) hours as needed for muscle spasms.      Follow-up Information    Paralee Cancel, MD. Schedule an appointment as soon as possible for a visit in 2 week(s).   Specialty:  Orthopedic Surgery Contact information: [address]  [place] [address] [phone]           Signed: Ardeen Jourdain, PA-C Orthopaedic Surgery 07/14/2018, 3:03 PM

## 2018-07-14 NOTE — Evaluation (Signed)
Physical Therapy Evaluation Patient Details Name: Veronica Wells MRN: [id number] DOB: [birth year] Today's Date: 07/14/2018   History of Present Illness  Pt s/p L THR  and with hx of R THR and R TKR.  Clinical Impression  Pt s/p L THR and presents with decreased L LE strength/ROM and post op pain limiting functional mobility.  Pt should progress to dc home with family assist.    Follow Up Recommendations Follow surgeon's recommendation for DC plan and follow-up therapies    Equipment Recommendations  None recommended by PT    Recommendations for Other Services       Precautions / Restrictions Precautions Precautions: Fall Restrictions Weight Bearing Restrictions: No      Mobility  Bed Mobility Overal bed mobility: Needs Assistance Bed Mobility: Supine to Sit     Supine to sit: Min assist     General bed mobility comments: cues for sequence and use of R LE to self assist  Transfers Overall transfer level: Needs assistance Equipment used: Standard walker Transfers: Sit to/from Stand Sit to Stand: Min assist         General transfer comment: cues for LE management and use of UEs to self assist  Ambulation/Gait Ambulation/Gait assistance: Min assist Gait Distance (Feet): 123 Feet Assistive device: Rolling walker (2 wheeled) Gait Pattern/deviations: Step-to pattern;Decreased step length - right;Decreased step length - left;Shuffle;Trunk flexed Gait velocity: decr   General Gait Details: cues for sequence, posture and position from [employer]            Wheelchair Mobility    Modified Rankin (Stroke Patients Only)       Balance Overall balance assessment: Mild deficits observed, not formally tested                                           Pertinent Vitals/Pain Pain Assessment: 0-10 Pain Score: 3  Pain Location: L hip Pain Descriptors / Indicators: Aching;Spasm Pain Intervention(s): Limited activity within  patient's tolerance;Monitored during session;Premedicated before session;Ice applied    Home Living Family/patient expects to be discharged to:: Private residence Living Arrangements: Spouse/significant other Available Help at Discharge: Family Type of Home: House Home Access: Stairs to enter Entrance Stairs-Rails: None Technical brewer of Steps: 3 Home Layout: Two level Home Equipment: Environmental consultant - standard;Cane - single point      Prior Function Level of Independence: Independent         Comments: Played tennis day before surgery     Hand Dominance        Extremity/Trunk Assessment   Upper Extremity Assessment Upper Extremity Assessment: Overall WFL for tasks assessed    Lower Extremity Assessment Lower Extremity Assessment: LLE deficits/detail LLE Deficits / Details: 2+/5 strength at hip with AAROM at hip to 80 flex and 15 abd       Communication   Communication: No difficulties  Cognition Arousal/Alertness: Awake/alert Behavior During Therapy: WFL for tasks assessed/performed Overall Cognitive Status: Within Functional Limits for tasks assessed                                        General Comments      Exercises Total Joint Exercises Ankle Circles/Pumps: AROM;Both;15 reps;Supine Quad Sets: AROM;Both;10 reps;Supine Heel Slides: AAROM;Left;20 reps;Supine Hip ABduction/ADduction: AAROM;Left;15 reps;Supine  Assessment/Plan    PT Assessment Patient needs continued PT services  PT Problem List Decreased strength;Decreased range of motion;Decreased activity tolerance;Decreased mobility;Decreased knowledge of use of DME;Pain;Decreased knowledge of precautions       PT Treatment Interventions DME instruction;Gait training;Stair training;Functional mobility training;Therapeutic activities;Therapeutic exercise;Patient/family education    PT Goals (Current goals can be found in the Care Plan section)  Acute Rehab PT Goals Patient Stated  Goal: regain IND PT Goal Formulation: With patient Time For Goal Achievement: 07/21/18 Potential to Achieve Goals: Good    Frequency 7X/week   Barriers to discharge        Co-evaluation               AM-PAC PT "6 Clicks" Daily Activity  Outcome Measure Difficulty turning over in bed (including adjusting bedclothes, sheets and blankets)?: Unable Difficulty moving from lying on back to sitting on the side of the bed? : Unable Difficulty sitting down on and standing up from a chair with arms (e.g., wheelchair, bedside commode, etc,.)?: Unable Help needed moving to and from a bed to chair (including a wheelchair)?: A Little Help needed walking in hospital room?: A Little Help needed climbing 3-5 steps with a railing? : A Little 6 Click Score: 12    End of Session Equipment Utilized During Treatment: Gait belt Activity Tolerance: Patient tolerated treatment well Patient left: in chair;with call bell/phone within reach Nurse Communication: Mobility status PT Visit Diagnosis: Difficulty in walking, not elsewhere classified (R26.2)    Time: 6720-9470 PT Time Calculation (min) (ACUTE ONLY): 38 min   Charges:   PT Evaluation $PT Eval Low Complexity: 1 Low PT Treatments $Gait Training: 8-22 mins $Therapeutic Exercise: 8-22 mins        [place] Pager [phone] Office [phone]   Nakeesha Bowler 07/14/2018, 11:08 AM

## 2018-07-14 NOTE — Progress Notes (Signed)
Physical Therapy Treatment Patient Details Name: Veronica Wells MRN: [id number] DOB: 09/03/1936 Today's Date: 07/14/2018    History of Present Illness Pt s/p L THR  and with hx of R THR and R TKR.    PT Comments    Pt continues cooperative but ltd by pain.  Spouse present to review stairs with written instruction provided.   Follow Up Recommendations  Follow surgeon's recommendation for DC plan and follow-up therapies     Equipment Recommendations  None recommended by PT    Recommendations for Other Services       Precautions / Restrictions Precautions Precautions: Fall Restrictions Weight Bearing Restrictions: No    Mobility  Bed Mobility               General bed mobility comments: Pt up in chair and requests back to same  Transfers Overall transfer level: Needs assistance Equipment used: Standard walker Transfers: Sit to/from Stand Sit to Stand: Min guard         General transfer comment: cues for LE management and use of UEs to self assist  Ambulation/Gait Ambulation/Gait assistance: Min guard Gait Distance (Feet): 75 Feet Assistive device: Rolling walker (2 wheeled) Gait Pattern/deviations: Step-to pattern;Decreased step length - right;Decreased step length - left;Shuffle;Trunk flexed Gait velocity: decr   General Gait Details: cues for sequence, posture and position from RW   Stairs Stairs: Yes Stairs assistance: Min assist Stair Management: No rails;Step to pattern;Forwards;With walker Number of Stairs: 2 General stair comments: single step twice with SW and cues for sequence and foot/SW placement.   Wheelchair Mobility    Modified Rankin (Stroke Patients Only)       Balance Overall balance assessment: Mild deficits observed, not formally tested                                          Cognition Arousal/Alertness: Awake/alert Behavior During Therapy: WFL for tasks assessed/performed Overall  Cognitive Status: Within Functional Limits for tasks assessed                                        Exercises Total Joint Exercises Ankle Circles/Pumps: AROM;Both;15 reps;Supine Quad Sets: AROM;Both;10 reps;Supine Heel Slides: AAROM;Left;20 reps;Supine Hip ABduction/ADduction: AAROM;Left;15 reps;Supine    General Comments        Pertinent Vitals/Pain Pain Assessment: 0-10 Pain Score: 5  Pain Location: L hip Pain Descriptors / Indicators: Aching;Spasm Pain Intervention(s): Limited activity within patient's tolerance;Monitored during session;Premedicated before session;Ice applied    Home Living                      Prior Function            PT Goals (current goals can now be found in the care plan section) Acute Rehab PT Goals Patient Stated Goal: regain IND PT Goal Formulation: With patient Time For Goal Achievement: 07/21/18 Potential to Achieve Goals: Good Progress towards PT goals: Progressing toward goals    Frequency    7X/week      PT Plan Current plan remains appropriate    Co-evaluation              AM-PAC PT "6 Clicks" Daily Activity  Outcome Measure  Difficulty turning over in bed (including adjusting bedclothes, sheets and  blankets)?: Unable Difficulty moving from lying on back to sitting on the side of the bed? : Unable Difficulty sitting down on and standing up from a chair with arms (e.g., wheelchair, bedside commode, etc,.)?: A Lot Help needed moving to and from a bed to chair (including a wheelchair)?: A Little Help needed walking in hospital room?: A Little Help needed climbing 3-5 steps with a railing? : A Little 6 Click Score: 13    End of Session Equipment Utilized During Treatment: Gait belt Activity Tolerance: Patient tolerated treatment well Patient left: in chair;with call bell/phone within reach Nurse Communication: Mobility status PT Visit Diagnosis: Difficulty in walking, not elsewhere  classified (R26.2)     Time: 1848-5927 PT Time Calculation (min) (ACUTE ONLY): 19 min  Charges:  $Gait Training: 8-22 mins $Therapeutic Exercise: 8-22 mins $Therapeutic Activity: 8-22 mins                     Secretary Pager [phone] Office [phone]    Kaytlan Behrman 07/14/2018, 3:58 PM

## 2018-07-14 NOTE — Progress Notes (Signed)
   Subjective: 1 Day Post-Op Procedure(s) (LRB): LEFT TOTAL HIP ARTHROPLASTY ANTERIOR APPROACH (Left) Patient reports pain as mild.   Patient seen in rounds with Dr. Alvan Dame. Patient is well, and has had no acute complaints or problems other than discomfort in the left hip. No issues overnight. No SOB or chest pain.  Plan is to go Home after hospital stay.  Objective: Vital signs in last 24 hours: Temp:  [97 F (36.1 C)-98.4 F (36.9 C)] 98.1 F (36.7 C) (10/02 0511) Pulse Rate:  [59-90] 66 (10/02 0511) Resp:  [15-20] 20 (10/02 0511) BP: (93-161)/(47-91) 115/60 (10/02 0511) SpO2:  [77 %-100 %] 99 % (10/02 0511) Weight:  [44.9 kg] 44.9 kg (10/01 1204)  Intake/Output from previous day:  Intake/Output Summary (Last 24 hours) at 07/14/2018 0714 Last data filed at 07/14/2018 0600 Gross per 24 hour  Intake 2781.34 ml  Output 1200 ml  Net 1581.34 ml     Labs: Recent Labs    07/14/18 0356  HGB 10.9*   Recent Labs    07/14/18 0356  WBC 8.4  RBC 3.54*  HCT 32.8*  PLT 259   Recent Labs    07/14/18 0356  NA 138  K 4.2  CL 106  CO2 24  BUN 29*  CREATININE 0.84  GLUCOSE 146*  CALCIUM 8.4*    EXAM General - Patient is Alert and Oriented Extremity - Neurologically intact Intact pulses distally Dorsiflexion/Plantar flexion intact No cellulitis present Compartment soft Dressing - dressing C/D/I Motor Function - intact, moving foot and toes well on exam.    Past Medical History:  Diagnosis Date  . Arthritis   . Cancer ([place]) 2003   melanoma on back right arm  . Difficulty sleeping   . Environmental allergies   . Headaches, cluster   . Hemorrhoids   . Osteopenia     Assessment/Plan: 1 Day Post-Op Procedure(s) (LRB): LEFT TOTAL HIP ARTHROPLASTY ANTERIOR APPROACH (Left) Active Problems:   Status post total replacement of left hip  Estimated body mass index is 20.69 kg/m as calculated from the following:   Height as of this encounter: 4\' 10"  (1.473 m).  Weight as of this encounter: 44.9 kg. Advance diet Up with therapy D/C IV fluids when tolerating POs well  DVT Prophylaxis - Aspirin Weight Bearing As Tolerated   Will have her get up with therapy today. Plan for DC home today with HEP if she continues to do well. Follow up in office in 2 weeks. Discharge instructions given.   Ardeen Jourdain, PA-C Orthopaedic Surgery 07/14/2018, 7:14 AM

## 2018-07-14 NOTE — Plan of Care (Signed)
Plane of care previewed with pt. Pt is stable. Pain management in progress, effective.

## 2018-07-14 NOTE — Care Plan (Signed)
L THA on 07-13-18 DCP:  Home with spouse.  1 story home with 2 ste. DME:  No needs.  PT:  HEP

## 2018-07-15 ENCOUNTER — Encounter (HOSPITAL_COMMUNITY): Payer: Self-pay | Admitting: Orthopedic Surgery

## 2018-07-15 NOTE — Anesthesia Postprocedure Evaluation (Signed)
Anesthesia Post Note  Patient: Veronica Wells [name]  Procedure(s) Performed: LEFT TOTAL HIP ARTHROPLASTY ANTERIOR APPROACH (Left Hip)     Patient location during evaluation: PACU Anesthesia Type: MAC and Spinal Level of consciousness: awake and alert Pain management: pain level controlled Vital Signs Assessment: post-procedure vital signs reviewed and stable Respiratory status: spontaneous breathing, nonlabored ventilation, respiratory function stable and patient connected to nasal cannula oxygen Cardiovascular status: stable and blood pressure returned to baseline Postop Assessment: no apparent nausea or vomiting and spinal receding Anesthetic complications: no    Last Vitals:  Vitals:   07/14/18 1007 07/14/18 1423  BP: (!) 100/54 106/67  Pulse: 60 63  Resp: 19 18  Temp: 36.6 C 36.6 C  SpO2: 95% 98%    Last Pain:  Vitals:   07/14/18 1343  TempSrc:   PainSc: 0-No pain                 Yamilet Mcfayden

## 2018-07-21 DIAGNOSIS — Z4732 Aftercare following explantation of hip joint prosthesis: Secondary | ICD-10-CM | POA: Diagnosis not present

## 2018-08-08 DIAGNOSIS — Z23 Encounter for immunization: Secondary | ICD-10-CM | POA: Diagnosis not present

## 2018-09-28 DIAGNOSIS — H1045 Other chronic allergic conjunctivitis: Secondary | ICD-10-CM | POA: Diagnosis not present

## 2018-10-07 DIAGNOSIS — L814 Other melanin hyperpigmentation: Secondary | ICD-10-CM | POA: Diagnosis not present

## 2018-10-07 DIAGNOSIS — L570 Actinic keratosis: Secondary | ICD-10-CM | POA: Diagnosis not present

## 2018-10-07 DIAGNOSIS — Z85828 Personal history of other malignant neoplasm of skin: Secondary | ICD-10-CM | POA: Diagnosis not present

## 2018-10-07 DIAGNOSIS — D225 Melanocytic nevi of trunk: Secondary | ICD-10-CM | POA: Diagnosis not present

## 2018-10-07 DIAGNOSIS — L821 Other seborrheic keratosis: Secondary | ICD-10-CM | POA: Diagnosis not present

## 2018-10-07 DIAGNOSIS — D1801 Hemangioma of skin and subcutaneous tissue: Secondary | ICD-10-CM | POA: Diagnosis not present

## 2019-01-13 DIAGNOSIS — M25552 Pain in left hip: Secondary | ICD-10-CM | POA: Diagnosis not present

## 2019-01-13 DIAGNOSIS — G44001 Cluster headache syndrome, unspecified, intractable: Secondary | ICD-10-CM | POA: Diagnosis not present

## 2019-02-03 DIAGNOSIS — L570 Actinic keratosis: Secondary | ICD-10-CM | POA: Diagnosis not present

## 2019-02-03 DIAGNOSIS — D692 Other nonthrombocytopenic purpura: Secondary | ICD-10-CM | POA: Diagnosis not present

## 2019-02-03 DIAGNOSIS — L820 Inflamed seborrheic keratosis: Secondary | ICD-10-CM | POA: Diagnosis not present

## 2019-02-03 DIAGNOSIS — L578 Other skin changes due to chronic exposure to nonionizing radiation: Secondary | ICD-10-CM | POA: Diagnosis not present

## 2019-07-27 DIAGNOSIS — Z23 Encounter for immunization: Secondary | ICD-10-CM | POA: Diagnosis not present

## 2019-08-04 DIAGNOSIS — L570 Actinic keratosis: Secondary | ICD-10-CM | POA: Diagnosis not present

## 2019-08-04 DIAGNOSIS — Z85828 Personal history of other malignant neoplasm of skin: Secondary | ICD-10-CM | POA: Diagnosis not present

## 2019-08-04 DIAGNOSIS — D045 Carcinoma in situ of skin of trunk: Secondary | ICD-10-CM | POA: Diagnosis not present

## 2019-08-04 DIAGNOSIS — Z85820 Personal history of malignant melanoma of skin: Secondary | ICD-10-CM | POA: Diagnosis not present

## 2019-09-01 DIAGNOSIS — L570 Actinic keratosis: Secondary | ICD-10-CM | POA: Diagnosis not present

## 2019-10-28 DIAGNOSIS — H2513 Age-related nuclear cataract, bilateral: Secondary | ICD-10-CM | POA: Diagnosis not present

## 2019-12-07 DIAGNOSIS — Z85820 Personal history of malignant melanoma of skin: Secondary | ICD-10-CM | POA: Diagnosis not present

## 2019-12-07 DIAGNOSIS — C44319 Basal cell carcinoma of skin of other parts of face: Secondary | ICD-10-CM | POA: Diagnosis not present

## 2019-12-07 DIAGNOSIS — D485 Neoplasm of uncertain behavior of skin: Secondary | ICD-10-CM | POA: Diagnosis not present

## 2019-12-07 DIAGNOSIS — D1801 Hemangioma of skin and subcutaneous tissue: Secondary | ICD-10-CM | POA: Diagnosis not present

## 2019-12-07 DIAGNOSIS — L570 Actinic keratosis: Secondary | ICD-10-CM | POA: Diagnosis not present

## 2019-12-07 DIAGNOSIS — C44729 Squamous cell carcinoma of skin of left lower limb, including hip: Secondary | ICD-10-CM | POA: Diagnosis not present

## 2020-03-16 ENCOUNTER — Telehealth: Payer: Self-pay | Admitting: Family Medicine

## 2020-03-16 NOTE — Telephone Encounter (Signed)
[facility] for referral. Either neuro or headache specialists.

## 2020-03-16 NOTE — Telephone Encounter (Signed)
Pt states she lives in [place] for half the year and [place] for the other half. She sees an Garment/textile technologist in [place] for her severe headaches she has had since 84 years old. She would like to see a different Neurologist, due to not having any relief after using several methods. She is wondering if PCP could send a referral?   Pt can be reached at [phone]

## 2020-03-19 NOTE — Telephone Encounter (Signed)
Left a detailed message at the pts home number with the information below and asked that she return a call with her preference.

## 2020-04-11 ENCOUNTER — Other Ambulatory Visit: Payer: Self-pay

## 2020-04-11 ENCOUNTER — Ambulatory Visit (INDEPENDENT_AMBULATORY_CARE_PROVIDER_SITE_OTHER): Payer: Medicare HMO | Admitting: Family Medicine

## 2020-04-11 ENCOUNTER — Encounter: Payer: Self-pay | Admitting: Family Medicine

## 2020-04-11 VITALS — BP 122/60 | HR 55 | Temp 98.7°F | Ht <= 58 in | Wt 99.0 lb

## 2020-04-11 DIAGNOSIS — G44021 Chronic cluster headache, intractable: Secondary | ICD-10-CM

## 2020-04-11 MED ORDER — DIVALPROEX SODIUM ER 500 MG PO TB24: 1000.0000 mg | ORAL_TABLET | Freq: Two times a day (BID) | ORAL | 5 refills | Status: DC

## 2020-04-11 NOTE — Progress Notes (Signed)
Taisha Pennebaker [name] DOB: 1935/11/22 Encounter date: 04/11/2020  This is a 84 y.o. female who presents with Chief Complaint  Patient presents with  . Headache    History of present illness: This is her cluster period and they have been awful. Has neurologist in [place] where she stays for 6 mo out of the year. Takes Depakote which hasn't helped. Dose was increased - makes her a little less sure footed. Taking 2 in the morning and 2 at night. Instead of getting 4-5 headaches a day getting 1-2 headaches/day. Hoping to decrease by one pill when she has a 5 day period without headaches. If she is able to catch headache at onset, she feels that she can stop it. If she misses onset then she is stuck with headache and can last 4-5 hours or go into another right away. Left side - tearing, dropping.   Pills are $165 for 30 day supply. Has enough for 9 more days. Hoping to switch to generic. When the headaches first start they are so severe, didn't feel like the generic was helpful.   Hasn't tried oxygen before, but since they are somewhat improved she is not sure she wants to start that process now.   No Known Allergies Current Meds  Medication Sig  . aspirin 81 MG chewable tablet Chew 1 tablet (81 mg total) by mouth 2 (two) times daily.  . Calcium Carbonate-Vitamin D (CALCIUM + D PO) Take 1 tablet by mouth daily.   . [DISCONTINUED] Divalproex Sodium (DEPAKOTE ER PO) Take 2 tablets by mouth in the morning and at bedtime.  . [DISCONTINUED] HYDROcodone-acetaminophen (NORCO/VICODIN) 5-325 MG tablet Take 1-2 tablets by mouth every 6 (six) hours as needed for moderate pain (pain score 4-6).  . [DISCONTINUED] methocarbamol (ROBAXIN) 500 MG tablet Take 1 tablet (500 mg total) by mouth every 6 (six) hours as needed for muscle spasms.    Review of Systems  Constitutional: Negative for chills, fatigue and fever.  HENT:       Unilateral eye drainage nose drainage associated with headaches.   Respiratory: Negative for cough, chest tightness, shortness of breath and wheezing.   Cardiovascular: Negative for chest pain, palpitations and leg swelling.  Neurological: Positive for headaches (Has been getting cluster headaches since she was a child.  Always the same pattern.  Always on the left side.).    Objective:  BP 122/60 (BP Location: Left Arm, Patient Position: Sitting, Cuff Size: Normal)   Pulse (!) 55   Temp 98.7 F (37.1 C) (Temporal)   Ht 4\' 10"  (1.473 m)   Wt 99 lb (44.9 kg)   SpO2 98%   BMI 20.69 kg/m   Weight: 99 lb (44.9 kg)   BP Readings from Last 3 Encounters:  04/11/20 122/60  07/14/18 106/67  07/07/18 (!) 173/66   Wt Readings from Last 3 Encounters:  04/11/20 99 lb (44.9 kg)  07/13/18 99 lb (44.9 kg)  07/07/18 99 lb (44.9 kg)    Physical Exam Constitutional:      General: She is not in acute distress.    Appearance: She is well-developed.  Eyes:     Pupils: Pupils are equal, round, and reactive to light.  Cardiovascular:     Rate and Rhythm: Normal rate and regular rhythm.     Heart sounds: Normal heart sounds. No murmur heard.  No friction rub.  Pulmonary:     Effort: Pulmonary effort is normal. No respiratory distress.     Breath sounds: Normal  breath sounds. No wheezing or rales.  Musculoskeletal:     Right lower leg: No edema.     Left lower leg: No edema.  Neurological:     Mental Status: She is alert and oriented to person, place, and time.  Psychiatric:        Behavior: Behavior normal.     Assessment/Plan 1. Intractable chronic cluster headache Headaches have improved since she has been on the Depakote.  She takes this only during the cluster headaches season for her which occurs for a few months every couple of years.  She is already working on decreasing her dose of Depakote and will continue to decrease down every week that she goes without headaches.  We did discuss alternative treatment options including oxygen, but since  headaches are improving right now and she has done pretty well with the Depakote, she prefers to continue this.  She will let me know if she is interested in oxygen in the future.  We discussed referral to neurology, but by the time she is able to see the specialist she will be back down in [place], so we agreed just to continue with me prescribing medication in the meanwhile.   Return in about 3 months (around 07/12/2020) for physical exam.    Micheline Rough, MD

## 2020-05-29 ENCOUNTER — Other Ambulatory Visit: Payer: Self-pay

## 2020-05-29 ENCOUNTER — Ambulatory Visit (INDEPENDENT_AMBULATORY_CARE_PROVIDER_SITE_OTHER): Payer: Medicare HMO

## 2020-05-29 DIAGNOSIS — Z0000 Encounter for general adult medical examination without abnormal findings: Secondary | ICD-10-CM | POA: Diagnosis not present

## 2020-05-29 NOTE — Patient Instructions (Addendum)
[name] Charna Archer , Thank you for taking time to come for your Medicare Wellness Visit. I appreciate your ongoing commitment to your health goals. Please review the following plan we discussed and let me know if I can assist you in the future.   Screening recommendations/referrals: Colonoscopy: No longer required Mammogram: No longer required Bone Density: Done 07/02/16 Recommended yearly ophthalmology/optometry visit for glaucoma screening and checkup Recommended yearly dental visit for hygiene and checkup  Vaccinations: Influenza vaccine: Up to date Pneumococcal vaccine: Discussed Tdap vaccine: Up to date Shingles vaccine: Shingrix discussed. Please contact your pharmacy for coverage information.    Covid-19:Completed and will bring copy in   Advanced directives: Please bring a copy of your health care power of attorney and living will to the office at your convenience.  Conditions/risks identified: Stay Healthy  Next appointment: Follow up in one year for your annual wellness visit    Preventive Care 65 Years and Older, Female Preventive care refers to lifestyle choices and visits with your health care provider that can promote health and wellness. What does preventive care include?  A yearly physical exam. This is also called an annual well check.  Dental exams once or twice a year.  Routine eye exams. Ask your health care provider how often you should have your eyes checked.  Personal lifestyle choices, including:  Daily care of your teeth and gums.  Regular physical activity.  Eating a healthy diet.  Avoiding tobacco and drug use.  Limiting alcohol use.  Practicing safe sex.  Taking low-dose aspirin every day.  Taking vitamin and mineral supplements as recommended by your health care provider. What happens during an annual well check? The services and screenings done by your health care provider during your annual well check will depend on your age, overall  health, lifestyle risk factors, and family history of disease. Counseling  Your health care provider may ask you questions about your:  Alcohol use.  Tobacco use.  Drug use.  Emotional well-being.  Home and relationship well-being.  Sexual activity.  Eating habits.  History of falls.  Memory and ability to understand (cognition).  Work and work Statistician.  Reproductive health. Screening  You may have the following tests or measurements:  Height, weight, and BMI.  Blood pressure.  Lipid and cholesterol levels. These may be checked every 5 years, or more frequently if you are over 68 years old.  Skin check.  Lung cancer screening. You may have this screening every year starting at age 38 if you have a 30-pack-year history of smoking and currently smoke or have quit within the past 15 years.  Fecal occult blood test (FOBT) of the stool. You may have this test every year starting at age 55.  Flexible sigmoidoscopy or colonoscopy. You may have a sigmoidoscopy every 5 years or a colonoscopy every 10 years starting at age 62.  Hepatitis C blood test.  Hepatitis B blood test.  Sexually transmitted disease (STD) testing.  Diabetes screening. This is done by checking your blood sugar (glucose) after you have not eaten for a while (fasting). You may have this done every 1-3 years.  Bone density scan. This is done to screen for osteoporosis. You may have this done starting at age 21.  Mammogram. This may be done every 1-2 years. Talk to your health care provider about how often you should have regular mammograms. Talk with your health care provider about your test results, treatment options, and if necessary, the need for  more tests. Vaccines  Your health care provider may recommend certain vaccines, such as:  Influenza vaccine. This is recommended every year.  Tetanus, diphtheria, and acellular pertussis (Tdap, Td) vaccine. You may need a Td booster every 10  years.  Zoster vaccine. You may need this after age 34.  Pneumococcal 13-valent conjugate (PCV13) vaccine. One dose is recommended after age 1.  Pneumococcal polysaccharide (PPSV23) vaccine. One dose is recommended after age 11. Talk to your health care provider about which screenings and vaccines you need and how often you need them. This information is not intended to replace advice given to you by your health care provider. Make sure you discuss any questions you have with your health care provider. Document Released: 10/26/2015 Document Revised: 06/18/2016 Document Reviewed: 07/31/2015 Elsevier Interactive Patient Education  2017 [employer] Prevention in the Home Falls can cause injuries. They can happen to people of all ages. There are many things you can do to make your home safe and to help prevent falls. What can I do on the outside of my home?  Regularly fix the edges of walkways and driveways and fix any cracks.  Remove anything that might make you trip as you walk through a door, such as a raised step or threshold.  Trim any bushes or trees on the path to your home.  Use bright outdoor lighting.  Clear any walking paths of anything that might make someone trip, such as rocks or tools.  Regularly check to see if handrails are loose or broken. Make sure that both sides of any steps have handrails.  Any raised decks and porches should have guardrails on the edges.  Have any leaves, snow, or ice cleared regularly.  Use sand or salt on walking paths during winter.  Clean up any spills in your garage right away. This includes oil or grease spills. What can I do in the bathroom?  Use night lights.  Install grab bars by the toilet and in the tub and shower. Do not use towel bars as grab bars.  Use non-skid mats or decals in the tub or shower.  If you need to sit down in the shower, use a plastic, non-slip stool.  Keep the floor dry. Clean up any water that  spills on the floor as soon as it happens.  Remove soap buildup in the tub or shower regularly.  Attach bath mats securely with double-sided non-slip rug tape.  Do not have throw rugs and other things on the floor that can make you trip. What can I do in the bedroom?  Use night lights.  Make sure that you have a light by your bed that is easy to reach.  Do not use any sheets or blankets that are too big for your bed. They should not hang down onto the floor.  Have a firm chair that has side arms. You can use this for support while you get dressed.  Do not have throw rugs and other things on the floor that can make you trip. What can I do in the kitchen?  Clean up any spills right away.  Avoid walking on wet floors.  Keep items that you use a lot in easy-to-reach places.  If you need to reach something above you, use a strong step stool that has a grab bar.  Keep electrical cords out of the way.  Do not use floor polish or wax that makes floors slippery. If you must use wax, use non-skid  floor wax.  Do not have throw rugs and other things on the floor that can make you trip. What can I do with my stairs?  Do not leave any items on the stairs.  Make sure that there are handrails on both sides of the stairs and use them. Fix handrails that are broken or loose. Make sure that handrails are as long as the stairways.  Check any carpeting to make sure that it is firmly attached to the stairs. Fix any carpet that is loose or worn.  Avoid having throw rugs at the top or bottom of the stairs. If you do have throw rugs, attach them to the floor with carpet tape.  Make sure that you have a light switch at the top of the stairs and the bottom of the stairs. If you do not have them, ask someone to add them for you. What else can I do to help prevent falls?  Wear shoes that:  Do not have high heels.  Have rubber bottoms.  Are comfortable and fit you well.  Are closed at the  toe. Do not wear sandals.  If you use a stepladder:  Make sure that it is fully opened. Do not climb a closed stepladder.  Make sure that both sides of the stepladder are locked into place.  Ask someone to hold it for you, if possible.  Clearly mark and make sure that you can see:  Any grab bars or handrails.  First and last steps.  Where the edge of each step is.  Use tools that help you move around (mobility aids) if they are needed. These include:  Canes.  Walkers.  Scooters.  Crutches.  Turn on the lights when you go into a dark area. Replace any light bulbs as soon as they burn out.  Set up your furniture so you have a clear path. Avoid moving your furniture around.  If any of your floors are uneven, fix them.  If there are any pets around you, be aware of where they are.  Review your medicines with your doctor. Some medicines can make you feel dizzy. This can increase your chance of falling. Ask your doctor what other things that you can do to help prevent falls. This information is not intended to replace advice given to you by your health care provider. Make sure you discuss any questions you have with your health care provider. Document Released: 07/26/2009 Document Revised: 03/06/2016 Document Reviewed: 11/03/2014 Elsevier Interactive Patient Education  2017 [employer].

## 2020-05-29 NOTE — Progress Notes (Signed)
Virtual Visit via Telephone Note  I connected with  Veronica Wells on 05/29/20 at  3:15 PM EDT by telephone and verified that I am speaking with the correct person using two identifiers.  Medicare Annual Wellness visit completed telephonically due to Covid-19 pandemic.   Persons participating in this call: This Health Coach and this patient.   Location: Patient: Home Provider: Office   I discussed the limitations, risks, security and privacy concerns of performing an evaluation and management service by telephone and the availability of in person appointments. The patient expressed understanding and agreed to proceed.  Unable to perform video visit due to video visit attempted and failed and/or patient does not have video capability.   Some vital signs may be absent or patient reported.   Willette Brace, LPN    Subjective:   Veronica Wells is a 84 y.o. female who presents for Medicare Annual (Subsequent) preventive examination.  Review of Systems     Cardiac Risk Factors include: advanced age (>56men, >54 women)     Objective:    There were no vitals filed for this visit. There is no height or weight on file to calculate BMI.  Advanced Directives 05/29/2020 07/13/2018 07/07/2018 06/19/2015 06/13/2015  Does Patient Have a Medical Advance Directive? Yes Yes Yes Yes Yes  Type of Advance Directive Living will [place];Living will [place];Living will [place];Living will [place];Living will  Does patient want to make changes to medical advance directive? - No - Patient declined No - Patient declined No - Patient declined No - Patient declined  Copy of [place] in Chart? Yes - validated most recent copy scanned in chart (See row information) No - copy requested No - copy requested No - copy requested No - copy requested    Current Medications  (verified) Outpatient Encounter Medications as of 05/29/2020  Medication Sig  . aspirin 81 MG chewable tablet Chew 1 tablet (81 mg total) by mouth 2 (two) times daily.  . Calcium Carbonate-Vitamin D (CALCIUM + D PO) Take 1 tablet by mouth daily.   . [DISCONTINUED] divalproex (DEPAKOTE ER) 500 MG 24 hr tablet Take 2 tablets (1,000 mg total) by mouth in the morning and at bedtime. (Patient not taking: Reported on 05/29/2020)   No facility-administered encounter medications on file as of 05/29/2020.    Allergies (verified) Patient has no known allergies.   History: Past Medical History:  Diagnosis Date  . Arthritis   . Cancer ([place]) 2003   melanoma on back right arm  . Difficulty sleeping   . Environmental allergies   . Headaches, cluster   . Hemorrhoids   . Osteopenia    Past Surgical History:  Procedure Laterality Date  . KNEE SURGERY  2011   Replacement/right knee  . MELANOMA EXCISION  2003  . TOTAL HIP ARTHROPLASTY Right 06/19/2015   Procedure: RIGHT TOTAL HIP ARTHROPLASTY ANTERIOR APPROACH;  Surgeon: Paralee Cancel, MD;  Location: [facility];  Service: Orthopedics;  Laterality: Right;  . TOTAL HIP ARTHROPLASTY Left 07/13/2018   Procedure: LEFT TOTAL HIP ARTHROPLASTY ANTERIOR APPROACH;  Surgeon: Paralee Cancel, MD;  Location: [facility];  Service: Orthopedics;  Laterality: Left;  70min  . TUBAL LIGATION     Family History  Problem Relation Age of Onset  . Cancer Mother 69       Colon  . Osteoporosis Mother   . Colon cancer Mother   .  Heart disease Father   . Heart failure Father   . Breast cancer Paternal Aunt        Age50's  . Breast cancer Paternal Aunt        Age 68's  . Osteoporosis Maternal Aunt   . Breast cancer Cousin        Age 16's  . Asthma Son   . Stomach cancer Neg Hx    Social History   Socioeconomic History  . Marital status: Married    Spouse name: Not on file  . Number of children: Not on file  . Years of education: Not on file  . Highest education level:  Not on file  Occupational History  . Occupation: retired  Tobacco Use  . Smoking status: Former Smoker    Types: Cigarettes    Quit date: 06/28/1975    Years since quitting: 44.9  . Smokeless tobacco: Never Used  Vaping Use  . Vaping Use: Never used  Substance and Sexual Activity  . Alcohol use: Yes    Alcohol/week: 3.0 standard drinks    Types: 3 Glasses of wine per week  . Drug use: No  . Sexual activity: Yes    Birth control/protection: Surgical, Post-menopausal  Other Topics Concern  . Not on file  Social History Narrative  . Not on file   Social Determinants of Health   Financial Resource Strain: Low Risk   . Difficulty of Paying Living Expenses: Not hard at all  Food Insecurity: No Food Insecurity  . Worried About Charity fundraiser in the Last Year: Never true  . Ran Out of Food in the Last Year: Never true  Transportation Needs: No Transportation Needs  . Lack of Transportation (Medical): No  . Lack of Transportation (Non-Medical): No  Physical Activity: Sufficiently Active  . Days of Exercise per Week: 5 days  . Minutes of Exercise per Session: 60 min  Stress: No Stress Concern Present  . Feeling of Stress : Not at all  Social Connections: Moderately Integrated  . Frequency of Communication with Friends and Family: More than three times a week  . Frequency of Social Gatherings with Friends and Family: More than three times a week  . Attends Religious Services: More than 4 times per year  . Active Member of Clubs or Organizations: No  . Attends Archivist Meetings: Never  . Marital Status: Married    Tobacco Counseling Counseling given: Not Answered   Clinical Intake:  Pre-visit preparation completed: Yes        BMI - recorded: 20.7 Nutritional Status: BMI of 19-24  Normal Nutritional Risks: None Diabetes: No  How often do you need to have someone help you when you read instructions, pamphlets, or other written materials from your  doctor or pharmacy?: 1 - Never  Diabetic?No  Interpreter Needed?: No  Information entered by :: Charlott Rakes LPN   Activities of Daily Living In your present state of health, do you have any difficulty performing the following activities: 05/29/2020  Hearing? N  Vision? N  Difficulty concentrating or making decisions? N  Walking or climbing stairs? N  Dressing or bathing? N  Doing errands, shopping? N  Preparing Food and eating ? N  Using the Toilet? N  In the past six months, have you accidently leaked urine? N  Do you have problems with loss of bowel control? N  Managing your Medications? N  Managing your Finances? N  Housekeeping or managing your Housekeeping? N  Some recent data might be hidden    Patient Care Team: Caren Macadam, MD as PCP - General (Family Medicine) Rolm Bookbinder, MD as Consulting Physician (Dermatology)  Indicate any recent Medical Services you may have received from other than [facility] providers in the past year (date may be approximate).     Assessment:   This is a routine wellness examination for [place].  Hearing/Vision screen  Hearing Screening   125Hz  250Hz  500Hz  1000Hz  2000Hz  3000Hz  4000Hz  6000Hz  8000Hz   Right ear:           Left ear:           Comments: Pt denies any hearing difficulty  Vision Screening Comments: Pt follows up with eye Dr Chapman Fitch in [place], [place] issues and exercise activities discussed: Current Exercise Habits: Home exercise routine, Type of exercise: walking (biking and tennis), Time (Minutes): 60, Frequency (Times/Week): 5, Weekly Exercise (Minutes/Week): 300  Goals    . Patient Stated     Stay healthy      Depression Screen PHQ 2/9 Scores 05/29/2020 07/05/2018  PHQ - 2 Score 0 0    Fall Risk Fall Risk  05/29/2020 07/05/2018  Falls in the past year? 0 No  Number falls in past yr: 0 -  Injury with Fall? 0 -  Follow up Falls prevention discussed -    Any stairs in or around the home?  Yes  If so, are there any without handrails? No  Home free of loose throw rugs in walkways, pet beds, electrical cords, etc? Yes  Adequate lighting in your home to reduce risk of falls? Yes   ASSISTIVE DEVICES UTILIZED TO PREVENT FALLS:  Life alert? No  Use of a cane, walker or w/c? No  Grab bars in the bathroom? No  Shower chair or bench in shower? Yes  Elevated toilet seat or a handicapped toilet? Yes   TIMED UP AND GO:  Was the test performed? No .    Cognitive Function:     6CIT Screen 05/29/2020  What Year? 0 points  What month? 0 points  Count back from 20 0 points  Months in reverse 0 points  Repeat phrase 10 points    Immunizations Immunization History  Administered Date(s) Administered  . Tdap 06/07/2013    TDAP status: Up to date Flu Vaccine status: Up to date Pneumococcal vaccine status: Declined,  Education has been provided regarding the importance of this vaccine but patient still declined. Advised may receive this vaccine at local pharmacy or Health Dept. Aware to provide a copy of the vaccination record if obtained from local pharmacy or Health Dept. Verbalized acceptance and understanding.  Covid-19 vaccine status: Completed vaccinesPt will get copy to office  Qualifies for Shingles Vaccine? Yes   Zostavax completed No   Shingrix Completed?: No.    Education has been provided regarding the importance of this vaccine. Patient has been advised to call insurance company to determine out of pocket expense if they have not yet received this vaccine. Advised may also receive vaccine at local pharmacy or Health Dept. Verbalized acceptance and understanding.  Screening Tests Health Maintenance  Topic Date Due  . COVID-19 Vaccine (1) Never done  . PNA vac Low Risk Adult (1 of 2 - PCV13) Never done  . INFLUENZA VACCINE  05/13/2020  . TETANUS/TDAP  06/08/2023  . DEXA SCAN  Completed    Health Maintenance  Health Maintenance Due  Topic Date Due  .  COVID-19 Vaccine (1) Never done  . PNA vac Low Risk Adult (1 of 2 - PCV13) Never done  . INFLUENZA VACCINE  05/13/2020    Colorectal cancer screening: No longer required.  Mammogram status: No longer required.  Bone Density status: Completed 07/02/16. Results reflect: Bone density results: OSTEOPENIA. Repeat every 2 years.  Lung Cancer Screening: (Low Dose CT Chest recommended if Age 81-80 years, 30 pack-year currently smoking OR have quit w/in 15years.)   Additional Screening:    Vision Screening: Recommended annual ophthalmology exams for early detection of glaucoma and other disorders of the eye. Is the patient up to date with their annual eye exam?  Yes  Who is the provider or what is the name of the office in which the patient attends annual eye exams? Dr Elta Guadeloupe Laverda Sorenson  [facility] in [place]: Recommended annual dental exams for proper oral hygiene  Community Resource Referral / Chronic Care Management: CRR required this visit?  No   CCM required this visit?  No      Plan:     I have personally reviewed and noted the following in the patient's chart:   . Medical and social history . Use of alcohol, tobacco or illicit drugs  . Current medications and supplements . Functional ability and status . Nutritional status . Physical activity . Advanced directives . List of other physicians . Hospitalizations, surgeries, and ER visits in previous 12 months . Vitals . Screenings to include cognitive, depression, and falls . Referrals and appointments  In addition, I have reviewed and discussed with patient certain preventive protocols, quality metrics, and best practice recommendations. A written personalized care plan for preventive services as well as general preventive health recommendations were provided to patient.     Willette Brace, LPN   [id number]   Nurse Notes: None

## 2020-06-29 ENCOUNTER — Encounter: Payer: Self-pay | Admitting: Obstetrics & Gynecology

## 2021-05-27 ENCOUNTER — Encounter: Payer: Self-pay | Admitting: Gastroenterology

## 2021-05-27 ENCOUNTER — Ambulatory Visit (INDEPENDENT_AMBULATORY_CARE_PROVIDER_SITE_OTHER): Payer: Medicare PPO | Admitting: Internal Medicine

## 2021-05-27 ENCOUNTER — Encounter: Payer: Self-pay | Admitting: Internal Medicine

## 2021-05-27 VITALS — BP 120/74 | HR 68 | Ht <= 58 in | Wt 97.2 lb

## 2021-05-27 DIAGNOSIS — K5730 Diverticulosis of large intestine without perforation or abscess without bleeding: Secondary | ICD-10-CM

## 2021-05-27 DIAGNOSIS — Z800 Family history of malignant neoplasm of digestive organs: Secondary | ICD-10-CM | POA: Diagnosis not present

## 2021-05-27 DIAGNOSIS — Z86010 Personal history of colonic polyps: Secondary | ICD-10-CM | POA: Diagnosis not present

## 2021-05-27 DIAGNOSIS — K635 Polyp of colon: Secondary | ICD-10-CM | POA: Insufficient documentation

## 2021-05-27 NOTE — Patient Instructions (Signed)
If you are age 83 or older, your body mass index should be between 23-30. Your Body mass index is 20.68 kg/m. If this is out of the aforementioned range listed, please consider follow up with your Primary Care Provider.  If you are age 16 or younger, your body mass index should be between 19-25. Your Body mass index is 20.68 kg/m. If this is out of the aformentioned range listed, please consider follow up with your Primary Care Provider.   __________________________________________________________  The [facility] providers would like to encourage you to use [facility] to communicate with providers for non-urgent requests or questions.  Due to long hold times on the telephone, sending your provider a message by [facility] may be a faster and more efficient way to get a response.  Please allow 48 business hours for a response.  Please remember that this is for non-urgent requests.   Please follow up as needed

## 2021-05-27 NOTE — Progress Notes (Signed)
HISTORY OF PRESENT ILLNESS:  Veronica Wells is a 85 y.o. female with past medical history as listed below.  She presents today questioning whether she needs surveillance colonoscopy.  There is a family history of colon cancer in her mother (diagnosed in her 19s".  Patient has had multiple prior colonoscopies including 1995, 2000, 2005, 2010, and most recently September 2015.  She is known to have diverticulosis.  Last examination revealed a 2 mm adenoma.  The exam prior to that was negative for neoplasia.  Her GI review of systems is entirely negative.  She is otherwise well.  She inquires about Cologuard (reports that her friend had Cologuard testing performed)  REVIEW OF SYSTEMS:  All non-GI ROS negative unless otherwise stated in the HPI except for headaches, sleeping problems  Past Medical History:  Diagnosis Date   Arthritis    Colon polyps    Difficulty sleeping    Environmental allergies    Headaches, cluster    Hemorrhoids    HLD (hyperlipidemia)    Melanoma ([place]) 10/13/2001   melanoma on back right arm   Osteopenia     Past Surgical History:  Procedure Laterality Date   KNEE SURGERY Right 10/13/2009   Replacement/right knee   MELANOMA EXCISION  10/13/2001   TOTAL HIP ARTHROPLASTY Right 06/19/2015   Procedure: RIGHT TOTAL HIP ARTHROPLASTY ANTERIOR APPROACH;  Surgeon: Paralee Cancel, MD;  Location: [facility];  Service: Orthopedics;  Laterality: Right;   TOTAL HIP ARTHROPLASTY Left 07/13/2018   Procedure: LEFT TOTAL HIP ARTHROPLASTY ANTERIOR APPROACH;  Surgeon: Paralee Cancel, MD;  Location: [facility];  Service: Orthopedics;  Laterality: Left;  27mn   TUBAL LIGATION      Social History Veronica Wells reports that she quit smoking about 45 years ago. Her smoking use included cigarettes. She has never used smokeless tobacco. She reports current alcohol use of about 3.0 standard drinks per week. She reports that she does not use drugs.  family history  includes Asthma in her son; Breast cancer in her cousin, paternal aunt, and paternal aunt; Cancer - Colon (age of onset: 861 in her mother; Colon polyps in her son; Heart disease in her father; Heart failure in her father; Osteoporosis in her maternal aunt and mother.  No Known Allergies     PHYSICAL EXAMINATION: Vital signs: BP 120/74 (BP Location: Left Arm, Patient Position: Sitting, Cuff Size: Normal)   Pulse 68   Ht 4' 9.5" (1.461 m) Comment: height measured without shoes  Wt 97 lb 4 oz (44.1 kg)   BMI 20.68 kg/m   Constitutional: Thin but generally well-appearing, no acute distress Psychiatric: alert and oriented x3, cooperative Eyes: extraocular movements intact, anicteric, conjunctiva pink Mouth: oral pharynx moist, no lesions Neck: supple no lymphadenopathy Cardiovascular: heart regular rate and rhythm, no murmur Lungs: clear to auscultation bilaterally Abdomen: soft, nontender, nondistended, no obvious ascites, no peritoneal signs, normal bowel sounds, no organomegaly Rectal: Omitted Extremities: no clubbing, cyanosis, or lower extremity edema bilaterally Skin: no lesions on visible extremities Neuro: No focal deficits.  Cranial nerves intact  ASSESSMENT:  1.  Family history of colon cancer in mother at advanced age. 2.  Patient has a history of diminutive adenomas. 3.  Diverticulosis 4.  Negative GI review of systems  PLAN:  1.  I do not feel colonoscopy is indicated given her age, personal history, and clinical status.  We reviewed the most recent clinical guidelines. 2.  Cologuard not appropriate.  We discussed  this 3.  Follow-up as needed.  She knows to contact the office for any questions or problems.  She will return to the care of her primary provider 30 minutes was spent preparing to see the patient, reviewing test, obtaining history, performing physical exam, counseling the patient in detail regarding colon cancer surveillance needs, and documenting clinical  information in the health record

## 2021-06-05 ENCOUNTER — Other Ambulatory Visit: Payer: Self-pay

## 2021-06-05 ENCOUNTER — Ambulatory Visit (INDEPENDENT_AMBULATORY_CARE_PROVIDER_SITE_OTHER): Payer: Medicare PPO

## 2021-06-05 DIAGNOSIS — Z0000 Encounter for general adult medical examination without abnormal findings: Secondary | ICD-10-CM | POA: Diagnosis not present

## 2021-06-05 NOTE — Patient Instructions (Signed)
[name] , Thank you for taking time to come for your Medicare Wellness Visit. I appreciate your ongoing commitment to your health goals. Please review the following plan we discussed and let me know if I can assist you in the future.   Screening recommendations/referrals: Colonoscopy: No Longer required  Mammogram: No longer required  Bone Density: Done 04/01/16 Recommended yearly ophthalmology/optometry visit for glaucoma screening and checkup Recommended yearly dental visit for hygiene and checkup  Vaccinations: Influenza vaccine: Due Pneumococcal vaccine: Due Tdap vaccine: Done 06/07/13 repeat every 10 years due 06/08/23 Shingles vaccine: Shingrix discussed. Please contact your pharmacy for coverage information.   Covid-19:Will call back with dates Booster 06/30/20  Advanced directives: Please bring a copy of your health care power of attorney and living will to the office at your convenience.  Conditions/risks identified: None at this time   Next appointment: Follow up in one year for your annual wellness visit    Preventive Care 65 Years and Older, Female Preventive care refers to lifestyle choices and visits with your health care provider that can promote health and wellness. What does preventive care include? A yearly physical exam. This is also called an annual well check. Dental exams once or twice a year. Routine eye exams. Ask your health care provider how often you should have your eyes checked. Personal lifestyle choices, including: Daily care of your teeth and gums. Regular physical activity. Eating a healthy diet. Avoiding tobacco and drug use. Limiting alcohol use. Practicing safe sex. Taking low-dose aspirin every day. Taking vitamin and mineral supplements as recommended by your health care provider. What happens during an annual well check? The services and screenings done by your health care provider during your annual well check will depend on your  age, overall health, lifestyle risk factors, and family history of disease. Counseling  Your health care provider may ask you questions about your: Alcohol use. Tobacco use. Drug use. Emotional well-being. Home and relationship well-being. Sexual activity. Eating habits. History of falls. Memory and ability to understand (cognition). Work and work Statistician. Reproductive health. Screening  You may have the following tests or measurements: Height, weight, and BMI. Blood pressure. Lipid and cholesterol levels. These may be checked every 5 years, or more frequently if you are over 2 years old. Skin check. Lung cancer screening. You may have this screening every year starting at age 87 if you have a 30-pack-year history of smoking and currently smoke or have quit within the past 15 years. Fecal occult blood test (FOBT) of the stool. You may have this test every year starting at age 34. Flexible sigmoidoscopy or colonoscopy. You may have a sigmoidoscopy every 5 years or a colonoscopy every 10 years starting at age 60. Hepatitis C blood test. Hepatitis B blood test. Sexually transmitted disease (STD) testing. Diabetes screening. This is done by checking your blood sugar (glucose) after you have not eaten for a while (fasting). You may have this done every 1-3 years. Bone density scan. This is done to screen for osteoporosis. You may have this done starting at age 31. Mammogram. This may be done every 1-2 years. Talk to your health care provider about how often you should have regular mammograms. Talk with your health care provider about your test results, treatment options, and if necessary, the need for more tests. Vaccines  Your health care provider may recommend certain vaccines, such as: Influenza vaccine. This is recommended every year. Tetanus, diphtheria, and acellular pertussis (Tdap, Td) vaccine.  You may need a Td booster every 10 years. Zoster vaccine. You may need this after  age 68. Pneumococcal 13-valent conjugate (PCV13) vaccine. One dose is recommended after age 4. Pneumococcal polysaccharide (PPSV23) vaccine. One dose is recommended after age 5. Talk to your health care provider about which screenings and vaccines you need and how often you need them. This information is not intended to replace advice given to you by your health care provider. Make sure you discuss any questions you have with your health care provider. Document Released: 10/26/2015 Document Revised: 06/18/2016 Document Reviewed: 07/31/2015 Elsevier Interactive Patient Education  2017 [place] Prevention in the Home Falls can cause injuries. They can happen to people of all ages. There are many things you can do to make your home safe and to help prevent falls. What can I do on the outside of my home? Regularly fix the edges of walkways and driveways and fix any cracks. Remove anything that might make you trip as you walk through a door, such as a raised step or threshold. Trim any bushes or trees on the path to your home. Use bright outdoor lighting. Clear any walking paths of anything that might make someone trip, such as rocks or tools. Regularly check to see if handrails are loose or broken. Make sure that both sides of any steps have handrails. Any raised decks and porches should have guardrails on the edges. Have any leaves, snow, or ice cleared regularly. Use sand or salt on walking paths during winter. Clean up any spills in your garage right away. This includes oil or grease spills. What can I do in the bathroom? Use night lights. Install grab bars by the toilet and in the tub and shower. Do not use towel bars as grab bars. Use non-skid mats or decals in the tub or shower. If you need to sit down in the shower, use a plastic, non-slip stool. Keep the floor dry. Clean up any water that spills on the floor as soon as it happens. Remove soap buildup in the tub or shower  regularly. Attach bath mats securely with double-sided non-slip rug tape. Do not have throw rugs and other things on the floor that can make you trip. What can I do in the bedroom? Use night lights. Make sure that you have a light by your bed that is easy to reach. Do not use any sheets or blankets that are too big for your bed. They should not hang down onto the floor. Have a firm chair that has side arms. You can use this for support while you get dressed. Do not have throw rugs and other things on the floor that can make you trip. What can I do in the kitchen? Clean up any spills right away. Avoid walking on wet floors. Keep items that you use a lot in easy-to-reach places. If you need to reach something above you, use a strong step stool that has a grab bar. Keep electrical cords out of the way. Do not use floor polish or wax that makes floors slippery. If you must use wax, use non-skid floor wax. Do not have throw rugs and other things on the floor that can make you trip. What can I do with my stairs? Do not leave any items on the stairs. Make sure that there are handrails on both sides of the stairs and use them. Fix handrails that are broken or loose. Make sure that handrails are as long as  the stairways. Check any carpeting to make sure that it is firmly attached to the stairs. Fix any carpet that is loose or worn. Avoid having throw rugs at the top or bottom of the stairs. If you do have throw rugs, attach them to the floor with carpet tape. Make sure that you have a light switch at the top of the stairs and the bottom of the stairs. If you do not have them, ask someone to add them for you. What else can I do to help prevent falls? Wear shoes that: Do not have high heels. Have rubber bottoms. Are comfortable and fit you well. Are closed at the toe. Do not wear sandals. If you use a stepladder: Make sure that it is fully opened. Do not climb a closed stepladder. Make sure that  both sides of the stepladder are locked into place. Ask someone to hold it for you, if possible. Clearly mark and make sure that you can see: Any grab bars or handrails. First and last steps. Where the edge of each step is. Use tools that help you move around (mobility aids) if they are needed. These include: Canes. Walkers. Scooters. Crutches. Turn on the lights when you go into a dark area. Replace any light bulbs as soon as they burn out. Set up your furniture so you have a clear path. Avoid moving your furniture around. If any of your floors are uneven, fix them. If there are any pets around you, be aware of where they are. Review your medicines with your doctor. Some medicines can make you feel dizzy. This can increase your chance of falling. Ask your doctor what other things that you can do to help prevent falls. This information is not intended to replace advice given to you by your health care provider. Make sure you discuss any questions you have with your health care provider. Document Released: 07/26/2009 Document Revised: 03/06/2016 Document Reviewed: 11/03/2014 Elsevier Interactive Patient Education  2017 [employer].

## 2021-06-05 NOTE — Progress Notes (Signed)
Virtual Visit via Telephone Note  I connected with  Avamaria Faubel on 06/05/21 at  3:15 PM EDT by telephone and verified that I am speaking with the correct person using two identifiers.  Medicare Annual Wellness visit completed telephonically due to Covid-19 pandemic.   Persons participating in this call: This Health Coach and this patient.   Location: Patient: Home Provider: Office   I discussed the limitations, risks, security and privacy concerns of performing an evaluation and management service by telephone and the availability of in person appointments. The patient expressed understanding and agreed to proceed.  Unable to perform video visit due to video visit attempted and failed and/or patient does not have video capability.   Some vital signs may be absent or patient reported.   Willette Brace, LPN   Subjective:   Naydeline Nicholas [name] is a 85 y.o. female who presents for Medicare Annual (Subsequent) preventive examination.  Review of Systems     Cardiac Risk Factors include: advanced age (>110mn, >>28women);dyslipidemia     Objective:    There were no vitals filed for this visit. There is no height or weight on file to calculate BMI.  Advanced Directives 06/05/2021 05/29/2020 07/13/2018 07/07/2018 06/19/2015 06/13/2015  Does Patient Have a Medical Advance Directive? Yes Yes Yes Yes Yes Yes  Type of Advance Directive Living will Living will [place]Living will [place]Living will [place] will [place] will  Does patient want to make changes to medical advance directive? - - No - Patient declined No - Patient declined No - Patient declined No - Patient declined  Copy of [place] Chart? - Yes - validated most recent copy scanned in chart (See row information) No - copy requested No - copy requested No - copy requested No - copy requested     Current Medications (verified) Outpatient Encounter Medications as of 06/05/2021  Medication Sig   Calcium Carbonate-Vitamin D (CALCIUM + D PO) Take 1 tablet by mouth daily.    Omega-3 Fatty Acids (OMEGA 3 PO) Take 1 capsule by mouth daily.   No facility-administered encounter medications on file as of 06/05/2021.    Allergies (verified) Patient has no known allergies.   History: Past Medical History:  Diagnosis Date   Arthritis    Colon polyps    Difficulty sleeping    Environmental allergies    Headaches, cluster    Hemorrhoids    HLD (hyperlipidemia)    [place]   melanoma on back right arm   Osteopenia    Past Surgical History:  Procedure Laterality Date   KNEE SURGERY Right 10/13/2009   Replacement/right knee   MELANOMA EXCISION  10/13/2001   TOTAL HIP ARTHROPLASTY Right 06/19/2015   Procedure: RIGHT TOTAL HIP ARTHROPLASTY ANTERIOR APPROACH;  Surgeon: MParalee Cancel MD;  Location: [facility];  Service: Orthopedics;  Laterality: Right;   TOTAL HIP ARTHROPLASTY Left 07/13/2018   Procedure: LEFT TOTAL HIP ARTHROPLASTY ANTERIOR APPROACH;  Surgeon: OParalee Cancel MD;  Location: [facility];  Service: Orthopedics;  Laterality: Left;  738m   TUBAL LIGATION     Family History  Problem Relation Age of Onset   Cancer - Colon Mother [id number]     Colon   Osteoporosis Mother    Heart disease Father    Heart failure Father    Asthma Son    Colon polyps Son  Osteoporosis Maternal Aunt    Breast cancer Paternal 35        Age14's   Breast cancer Paternal 43        Age 47's   Breast cancer Cousin        Age 1's   Stomach cancer Neg Hx    Social History   Socioeconomic History   Marital status: Married    Spouse name: Not on file   Number of children: 3   Years of education: Not on file   Highest education level: Not on file  Occupational History   Occupation: retired  Tobacco Use   Smoking status: Former    Types: Cigarettes    Quit date:  06/28/1975    Years since quitting: 45.9   Smokeless tobacco: Never  Vaping Use   Vaping Use: Never used  Substance and Sexual Activity   Alcohol use: Yes    Alcohol/week: 3.0 standard drinks    Types: 3 Glasses of wine per week    Comment: social   Drug use: No   Sexual activity: Yes    Birth control/protection: Surgical, Post-menopausal  Other Topics Concern   Not on file  Social History Narrative   Not on file   Social Determinants of Health   Financial Resource Strain: Low Risk    Difficulty of Paying Living Expenses: Not hard at all  Food Insecurity: No Food Insecurity   Worried About Charity fundraiser in the Last Year: Never true   [place] in the Last Year: Never true  Transportation Needs: No Transportation Needs   Lack of Transportation (Medical): No   Lack of Transportation (Non-Medical): No  Physical Activity: Sufficiently Active   Days of Exercise per Week: 5 days   Minutes of Exercise per Session: 60 min  Stress: No Stress Concern Present   Feeling of Stress : Not at all  Social Connections: Moderately Integrated   Frequency of Communication with Friends and Family: More than three times a week   Frequency of Social Gatherings with Friends and Family: More than three times a week   Attends Religious Services: More than 4 times per year   Active Member of [employer] or Organizations: No   Attends Music therapist: Never   Marital Status: Married    Tobacco Counseling Counseling given: Not Answered   Clinical Intake:  Pre-visit preparation completed: Yes  Pain : No/denies pain     BMI - recorded: 20.68 Nutritional Status: BMI of 19-24  Normal Nutritional Risks: None Diabetes: No  How often do you need to have someone help you when you read instructions, pamphlets, or other written materials from your doctor or pharmacy?: 1 - Never  Diabetic?No  Interpreter Needed?: No  Information entered by :: Amanda Cockayne,  LPN   Activities of Daily Living In your present state of health, do you have any difficulty performing the following activities: 06/05/2021  Hearing? N  Vision? N  Difficulty concentrating or making decisions? N  Walking or climbing stairs? N  Dressing or bathing? N  Doing errands, shopping? N  Preparing Food and eating ? N  Using the Toilet? N  In the past six months, have you accidently leaked urine? N  Do you have problems with loss of bowel control? N  Managing your Medications? N  Managing your Finances? N  Housekeeping or managing your Housekeeping? N  Some recent data might be hidden    Patient Care Team: Carolann Littler  W, MD as PCP - General (Family Medicine) Rolm Bookbinder, MD as Consulting Physician (Dermatology)  Indicate any recent Medical Services you may have received from other than [facility] providers in the past year (date may be approximate).     Assessment:   This is a routine wellness examination for [place].  Hearing/Vision screen Hearing Screening - Comments:: Pt denies hearing issues  Vision Screening - Comments:: Pt follows up with Dr Susette Racer  at [facility]  for annual eye   Dietary issues and exercise activities discussed: Current Exercise Habits: Home exercise routine, Type of exercise: walking;Other - see comments (tennis), Time (Minutes): > 60, Frequency (Times/Week): 5, Weekly Exercise (Minutes/Week): 0   Goals Addressed             This Visit's Progress    Patient Stated       None at this time       Depression Screen PHQ 2/9 Scores 06/05/2021 05/29/2020 07/05/2018  PHQ - 2 Score 0 0 0    Fall Risk Fall Risk  06/05/2021 05/29/2020 07/05/2018  Falls in the past year? 1 0 No  Number falls in past yr: 1 0 -  Injury with Fall? 0 0 -  Follow up Falls prevention discussed Falls prevention discussed -    FALL RISK PREVENTION PERTAINING TO THE HOME:  Any stairs in or around the home? Yes  If so, are there any without handrails? No   Home free of loose throw rugs in walkways, pet beds, electrical cords, etc? Yes  Adequate lighting in your home to reduce risk of falls? Yes   ASSISTIVE DEVICES UTILIZED TO PREVENT FALLS:  Life alert? No  Use of a cane, walker or w/c? No  Grab bars in the bathroom? No  Shower chair or bench in shower? No  Elevated toilet seat or a handicapped toilet? Yes   TIMED UP AND GO:  Was the test performed? No .    Cognitive Function:     6CIT Screen 06/05/2021 05/29/2020  What Year? 0 points 0 points  What month? 0 points 0 points  What time? 0 points -  Count back from 20 0 points 0 points  Months in reverse 0 points 0 points  Repeat phrase 0 points 10 points  Total Score 0 -    Immunizations Immunization History  Administered Date(s) Administered   PFIZER Comirnaty(Gray Top)Covid-19 Tri-Sucrose Vaccine 06/30/2020   Tdap 06/07/2013    TDAP status: Up to date  Flu Vaccine status: Due, Education has been provided regarding the importance of this vaccine. Advised may receive this vaccine at local pharmacy or Health Dept. Aware to provide a copy of the vaccination record if obtained from local pharmacy or Health Dept. Verbalized acceptance and understanding.  Pneumococcal vaccine status: Due, Education has been provided regarding the importance of this vaccine. Advised may receive this vaccine at local pharmacy or Health Dept. Aware to provide a copy of the vaccination record if obtained from local pharmacy or Health Dept. Verbalized acceptance and understanding.  Covid-19 vaccine status: Completed vaccines pt will call back with dates   Qualifies for Shingles Vaccine? Yes   Zostavax completed No   Shingrix Completed?: No.    Education has been provided regarding the importance of this vaccine. Patient has been advised to call insurance company to determine out of pocket expense if they have not yet received this vaccine. Advised may also receive vaccine at local pharmacy or Health  Dept. Verbalized acceptance and understanding.  Screening Tests Health Maintenance  Topic Date Due   Zoster Vaccines- Shingrix (1 of 2) Never done   PNA vac Low Risk Adult (1 of 2 - PCV13) Never done   COVID-19 Vaccine (2 - Pfizer series) 07/21/2020   INFLUENZA VACCINE  05/13/2021   TETANUS/TDAP  06/08/2023   DEXA SCAN  Completed   HPV VACCINES  Aged Out    Health Maintenance  Health Maintenance Due  Topic Date Due   Zoster Vaccines- Shingrix (1 of 2) Never done   PNA vac Low Risk Adult (1 of 2 - PCV13) Never done   COVID-19 Vaccine (2 - Pfizer series) 07/21/2020   INFLUENZA VACCINE  05/13/2021    Colorectal cancer screening: No longer required.   Mammogram status: No longer required due to age.  Bone Density status: Completed /20/17. Results reflect: Bone density results: OSTEOPENIA. Repeat every 2 years.  Additional Screening:   Vision Screening: Recommended annual ophthalmology exams for early detection of glaucoma and other disorders of the eye. Is the patient up to date with their annual eye exam?  Yes  Who is the provider or what is the name of the office in which the patient attends annual eye exams? Dr Caralee Ates If pt is not established with a provider, would they like to be referred to a provider to establish care? No .   Dental Screening: Recommended annual dental exams for proper oral hygiene  Community Resource Referral / Chronic Care Management: CRR required this visit?  No   CCM required this visit?  No      Plan:     I have personally reviewed and noted the following in the patient's chart:   Medical and social history Use of alcohol, tobacco or illicit drugs  Current medications and supplements including opioid prescriptions.  Functional ability and status Nutritional status Physical activity Advanced directives List of other physicians Hospitalizations, surgeries, and ER visits in previous 12 months Vitals Screenings to include  cognitive, depression, and falls Referrals and appointments  In addition, I have reviewed and discussed with patient certain preventive protocols, quality metrics, and best practice recommendations. A written personalized care plan for preventive services as well as general preventive health recommendations were provided to patient.     Willette Brace, LPN   [id number]   Nurse Notes: None

## 2021-06-23 ENCOUNTER — Emergency Department (HOSPITAL_COMMUNITY): Payer: Medicare PPO

## 2021-06-23 ENCOUNTER — Inpatient Hospital Stay (HOSPITAL_COMMUNITY)
Admission: EM | Admit: 2021-06-23 | Discharge: 2021-07-04 | DRG: 519 | Disposition: A | Discharge status: Skilled Nursing Facility | Payer: Medicare PPO | Attending: Surgery | Admitting: Surgery

## 2021-06-23 DIAGNOSIS — M898X9 Other specified disorders of bone, unspecified site: Secondary | ICD-10-CM | POA: Diagnosis present

## 2021-06-23 DIAGNOSIS — R944 Abnormal results of kidney function studies: Secondary | ICD-10-CM | POA: Diagnosis present

## 2021-06-23 DIAGNOSIS — Z23 Encounter for immunization: Secondary | ICD-10-CM | POA: Diagnosis present

## 2021-06-23 DIAGNOSIS — Z96643 Presence of artificial hip joint, bilateral: Secondary | ICD-10-CM | POA: Diagnosis present

## 2021-06-23 DIAGNOSIS — Z96649 Presence of unspecified artificial hip joint: Secondary | ICD-10-CM

## 2021-06-23 DIAGNOSIS — D62 Acute posthemorrhagic anemia: Secondary | ICD-10-CM | POA: Diagnosis not present

## 2021-06-23 DIAGNOSIS — Z9889 Other specified postprocedural states: Secondary | ICD-10-CM

## 2021-06-23 DIAGNOSIS — E878 Other disorders of electrolyte and fluid balance, not elsewhere classified: Secondary | ICD-10-CM | POA: Diagnosis present

## 2021-06-23 DIAGNOSIS — S332XXA Dislocation of sacroiliac and sacrococcygeal joint, initial encounter: Secondary | ICD-10-CM | POA: Diagnosis present

## 2021-06-23 DIAGNOSIS — Z20822 Contact with and (suspected) exposure to covid-19: Secondary | ICD-10-CM | POA: Diagnosis present

## 2021-06-23 DIAGNOSIS — M25569 Pain in unspecified knee: Secondary | ICD-10-CM

## 2021-06-23 DIAGNOSIS — S329XXA Fracture of unspecified parts of lumbosacral spine and pelvis, initial encounter for closed fracture: Secondary | ICD-10-CM | POA: Diagnosis present

## 2021-06-23 DIAGNOSIS — Y9355 Activity, bike riding: Secondary | ICD-10-CM | POA: Diagnosis not present

## 2021-06-23 DIAGNOSIS — M25562 Pain in left knee: Secondary | ICD-10-CM | POA: Diagnosis present

## 2021-06-23 DIAGNOSIS — K567 Ileus, unspecified: Secondary | ICD-10-CM | POA: Diagnosis not present

## 2021-06-23 DIAGNOSIS — T148XXA Other injury of unspecified body region, initial encounter: Secondary | ICD-10-CM

## 2021-06-23 DIAGNOSIS — T1490XA Injury, unspecified, initial encounter: Secondary | ICD-10-CM

## 2021-06-23 DIAGNOSIS — S32810A Multiple fractures of pelvis with stable disruption of pelvic ring, initial encounter for closed fracture: Principal | ICD-10-CM | POA: Diagnosis present

## 2021-06-23 DIAGNOSIS — Z87891 Personal history of nicotine dependence: Secondary | ICD-10-CM

## 2021-06-23 DIAGNOSIS — M25571 Pain in right ankle and joints of right foot: Secondary | ICD-10-CM | POA: Diagnosis present

## 2021-06-23 DIAGNOSIS — Y92410 Unspecified street and highway as the place of occurrence of the external cause: Secondary | ICD-10-CM | POA: Diagnosis not present

## 2021-06-23 DIAGNOSIS — Z96651 Presence of right artificial knee joint: Secondary | ICD-10-CM | POA: Diagnosis present

## 2021-06-23 DIAGNOSIS — Z419 Encounter for procedure for purposes other than remedying health state, unspecified: Secondary | ICD-10-CM

## 2021-06-23 DIAGNOSIS — S3282XA Multiple fractures of pelvis without disruption of pelvic ring, initial encounter for closed fracture: Secondary | ICD-10-CM | POA: Diagnosis present

## 2021-06-23 DIAGNOSIS — D696 Thrombocytopenia, unspecified: Secondary | ICD-10-CM | POA: Diagnosis not present

## 2021-06-23 DIAGNOSIS — M25579 Pain in unspecified ankle and joints of unspecified foot: Secondary | ICD-10-CM

## 2021-06-23 LAB — COMPREHENSIVE METABOLIC PANEL
ALT: 21 U/L (ref 0–44)
AST: 28 U/L (ref 15–41)
Albumin: 2.8 g/dL — ABNORMAL LOW (ref 3.5–5.0)
Alkaline Phosphatase: 54 U/L (ref 38–126)
Anion gap: 9 (ref 5–15)
BUN: 35 mg/dL — ABNORMAL HIGH (ref 8–23)
CO2: 20 mmol/L — ABNORMAL LOW (ref 22–32)
Calcium: 8.2 mg/dL — ABNORMAL LOW (ref 8.9–10.3)
Chloride: 108 mmol/L (ref 98–111)
Creatinine, Ser: 1.03 mg/dL — ABNORMAL HIGH (ref 0.44–1.00)
GFR, Estimated: 54 mL/min — ABNORMAL LOW (ref >60)
Glucose, Bld: 123 mg/dL — ABNORMAL HIGH (ref 70–99)
Potassium: 3.6 mmol/L (ref 3.5–5.1)
Sodium: 137 mmol/L (ref 135–145)
Total Bilirubin: 0.7 mg/dL (ref 0.3–1.2)
Total Protein: 5.5 g/dL — ABNORMAL LOW (ref 6.5–8.1)

## 2021-06-23 LAB — CBC
HCT: 39.1 % (ref 36.0–46.0)
HCT: 31.6 % — ABNORMAL LOW (ref 36.0–46.0)
Hemoglobin: 12.4 g/dL (ref 12.0–15.0)
Hemoglobin: 10 g/dL — ABNORMAL LOW (ref 12.0–15.0)
MCH: 31.2 pg (ref 26.0–34.0)
MCH: 31.5 pg (ref 26.0–34.0)
MCHC: 31.7 g/dL (ref 30.0–36.0)
MCHC: 31.6 g/dL (ref 30.0–36.0)
MCV: 98.2 fL (ref 80.0–100.0)
MCV: 99.7 fL (ref 80.0–100.0)
Platelets: 220 10*3/uL (ref 150–400)
Platelets: 157 10*3/uL (ref 150–400)
RBC: 3.98 MIL/uL (ref 3.87–5.11)
RBC: 3.17 MIL/uL — ABNORMAL LOW (ref 3.87–5.11)
RDW: 13.5 % (ref 11.5–15.5)
RDW: 13.6 % (ref 11.5–15.5)
WBC: 13.5 10*3/uL — ABNORMAL HIGH (ref 4.0–10.5)
WBC: 10.2 10*3/uL (ref 4.0–10.5)
nRBC: 0 % (ref 0.0–0.2)
nRBC: 0 % (ref 0.0–0.2)

## 2021-06-23 LAB — URINALYSIS, ROUTINE W REFLEX MICROSCOPIC
Bilirubin Urine: NEGATIVE
Glucose, UA: NEGATIVE mg/dL
Ketones, ur: NEGATIVE mg/dL
Leukocytes,Ua: NEGATIVE
Nitrite: NEGATIVE
Protein, ur: NEGATIVE mg/dL
Specific Gravity, Urine: 1.015 (ref 1.005–1.030)
pH: 6 (ref 5.0–8.0)

## 2021-06-23 LAB — RESP PANEL BY RT-PCR (FLU A&B, COVID) ARPGX2
Influenza A by PCR: NEGATIVE
Influenza B by PCR: NEGATIVE
SARS Coronavirus 2 by RT PCR: NEGATIVE

## 2021-06-23 LAB — URINALYSIS, MICROSCOPIC (REFLEX): Bacteria, UA: NONE SEEN

## 2021-06-23 LAB — LACTIC ACID, PLASMA: Lactic Acid, Venous: 2.5 mmol/L (ref 0.5–1.9)

## 2021-06-23 LAB — ETHANOL: Alcohol, Ethyl (B): <10 mg/dL (ref <10)

## 2021-06-23 LAB — CREATININE, SERUM
Creatinine, Ser: 0.61 mg/dL (ref 0.44–1.00)
GFR, Estimated: >60 mL/min (ref >60)

## 2021-06-23 LAB — PROTIME-INR
INR: 1 (ref 0.8–1.2)
Prothrombin Time: 12.7 seconds (ref 11.4–15.2)

## 2021-06-23 LAB — I-STAT CHEM 8, ED
BUN: 36 mg/dL — ABNORMAL HIGH (ref 8–23)
Calcium, Ion: 1.1 mmol/L — ABNORMAL LOW (ref 1.15–1.40)
Chloride: 108 mmol/L (ref 98–111)
Creatinine, Ser: 1 mg/dL (ref 0.44–1.00)
Glucose, Bld: 117 mg/dL — ABNORMAL HIGH (ref 70–99)
HCT: 39 % (ref 36.0–46.0)
Hemoglobin: 13.3 g/dL (ref 12.0–15.0)
Potassium: 3.9 mmol/L (ref 3.5–5.1)
Sodium: 139 mmol/L (ref 135–145)
TCO2: 21 mmol/L — ABNORMAL LOW (ref 22–32)

## 2021-06-23 MED ORDER — TETANUS-DIPHTH-ACELL PERTUSSIS 5-2.5-18.5 LF-MCG/0.5 IM SUSY
0.5000 mL | PREFILLED_SYRINGE | Freq: Once | INTRAMUSCULAR | Status: AC
Start: 2021-06-23 — End: 2021-06-23
  Administered 2021-06-23: 0.5 mL via INTRAMUSCULAR
  Filled 2021-06-23: qty 0.5

## 2021-06-23 MED ORDER — MORPHINE SULFATE (PF) 4 MG/ML IV SOLN
4.0000 mg | Freq: Once | INTRAVENOUS | Status: AC
  Administered 2021-06-23: 4 mg via INTRAVENOUS
  Filled 2021-06-23: qty 1

## 2021-06-23 MED ORDER — DOCUSATE SODIUM 100 MG PO CAPS
100.0000 mg | ORAL_CAPSULE | Freq: Two times a day (BID) | ORAL | Status: DC
  Administered 2021-06-23 – 2021-07-04 (×18): 100 mg via ORAL
  Filled 2021-06-23 (×21): qty 1

## 2021-06-23 MED ORDER — ACETAMINOPHEN 325 MG PO TABS
650.0000 mg | ORAL_TABLET | ORAL | Status: DC | PRN
  Administered 2021-06-24 (×2): 650 mg via ORAL
  Filled 2021-06-23 (×2): qty 2

## 2021-06-23 MED ORDER — MORPHINE SULFATE (PF) 2 MG/ML IV SOLN
2.0000 mg | INTRAVENOUS | Status: DC | PRN
  Administered 2021-06-23 (×2): 2 mg via INTRAVENOUS
  Filled 2021-06-23 (×2): qty 1

## 2021-06-23 MED ORDER — ENOXAPARIN SODIUM 30 MG/0.3ML IJ SOSY
30.0000 mg | PREFILLED_SYRINGE | INTRAMUSCULAR | Status: DC
  Administered 2021-06-26: 30 mg via SUBCUTANEOUS
  Filled 2021-06-23: qty 0.3

## 2021-06-23 MED ORDER — OXYCODONE HCL 5 MG PO TABS
5.0000 mg | ORAL_TABLET | ORAL | Status: DC | PRN
  Administered 2021-06-23 – 2021-06-24 (×3): 5 mg via ORAL
  Filled 2021-06-23 (×3): qty 1

## 2021-06-23 MED ORDER — DEXTROSE-NACL 5-0.45 % IV SOLN: INTRAVENOUS | Status: DC

## 2021-06-23 MED ORDER — METOPROLOL TARTRATE 5 MG/5ML IV SOLN: 5.0000 mg | Freq: Four times a day (QID) | INTRAVENOUS | Status: DC | PRN

## 2021-06-23 MED ORDER — IOHEXOL 350 MG/ML SOLN
80.0000 mL | Freq: Once | INTRAVENOUS | Status: AC | PRN
  Administered 2021-06-23: 80 mL via INTRAVENOUS

## 2021-06-23 MED ORDER — ONDANSETRON HCL 4 MG/2ML IJ SOLN
4.0000 mg | Freq: Once | INTRAMUSCULAR | Status: AC
  Administered 2021-06-23: 4 mg via INTRAVENOUS
  Filled 2021-06-23: qty 2

## 2021-06-23 MED ORDER — ONDANSETRON 4 MG PO TBDP: 4.0000 mg | ORAL_TABLET | Freq: Four times a day (QID) | ORAL | Status: DC | PRN

## 2021-06-23 MED ORDER — ONDANSETRON HCL 4 MG/2ML IJ SOLN: 4.0000 mg | Freq: Four times a day (QID) | INTRAMUSCULAR | Status: DC | PRN

## 2021-06-23 NOTE — ED Notes (Signed)
Patient transported to CT scan with RN on monitor

## 2021-06-23 NOTE — ED Notes (Signed)
Portable chest and pelvis done
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
## 2021-06-23 NOTE — ED Notes (Signed)
Attempted to call report, nurse unavailable.
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
## 2021-06-23 NOTE — TOC CAGE-AID Note (Addendum)
Transition of Care [facility]) - CAGE-AID Screening   Patient Details  Name: Veronica Wells MRN: [id number] Date of Birth: 1936/02/13  Clinical Narrative:  Patient denies any alcohol or drug abuse. States she may have a couple of small drinks a week but no need for resources at this time.  CAGE-AID Screening:    Have You Ever Felt You Ought to Cut Down on Your Drinking or Drug Use?: No Have People Annoyed You By Critizing Your Drinking Or Drug Use?: No Have You Felt Bad Or Guilty About Your Drinking Or Drug Use?: No Have You Ever Had a Drink or Used Drugs First Thing In The Morning to Steady Your Nerves or to Get Rid of a Hangover?: No CAGE-AID Score: 0  Substance Abuse Education Offered: No

## 2021-06-24 ENCOUNTER — Inpatient Hospital Stay (HOSPITAL_COMMUNITY): Payer: Medicare PPO

## 2021-06-24 ENCOUNTER — Other Ambulatory Visit: Payer: Self-pay

## 2021-06-24 ENCOUNTER — Encounter (HOSPITAL_COMMUNITY): Payer: Self-pay

## 2021-06-24 LAB — CBC
HCT: 28.7 % — ABNORMAL LOW (ref 36.0–46.0)
Hemoglobin: 8.8 g/dL — ABNORMAL LOW (ref 12.0–15.0)
MCH: 31.5 pg (ref 26.0–34.0)
MCHC: 30.7 g/dL (ref 30.0–36.0)
MCV: 102.9 fL — ABNORMAL HIGH (ref 80.0–100.0)
Platelets: 123 10*3/uL — ABNORMAL LOW (ref 150–400)
RBC: 2.79 MIL/uL — ABNORMAL LOW (ref 3.87–5.11)
RDW: 14.3 % (ref 11.5–15.5)
WBC: 6.1 10*3/uL (ref 4.0–10.5)
nRBC: 0 % (ref 0.0–0.2)

## 2021-06-24 LAB — BASIC METABOLIC PANEL
Anion gap: 9 (ref 5–15)
BUN: 46 mg/dL — ABNORMAL HIGH (ref 8–23)
CO2: 22 mmol/L (ref 22–32)
Calcium: 8.8 mg/dL — ABNORMAL LOW (ref 8.9–10.3)
Chloride: 105 mmol/L (ref 98–111)
Creatinine, Ser: 1.57 mg/dL — ABNORMAL HIGH (ref 0.44–1.00)
GFR, Estimated: 33 mL/min — ABNORMAL LOW (ref >60)
Glucose, Bld: 140 mg/dL — ABNORMAL HIGH (ref 70–99)
Potassium: 4.5 mmol/L (ref 3.5–5.1)
Sodium: 136 mmol/L (ref 135–145)

## 2021-06-24 LAB — SAMPLE TO BLOOD BANK

## 2021-06-24 MED ORDER — DIPHENHYDRAMINE HCL 25 MG PO CAPS
25.0000 mg | ORAL_CAPSULE | Freq: Once | ORAL | Status: AC
  Administered 2021-06-24: 25 mg via ORAL
  Filled 2021-06-24: qty 1

## 2021-06-24 MED ORDER — OXYCODONE HCL 5 MG PO TABS
5.0000 mg | ORAL_TABLET | ORAL | Status: DC | PRN
  Administered 2021-06-24 – 2021-06-25 (×3): 10 mg via ORAL
  Administered 2021-06-26 (×2): 5 mg via ORAL
  Administered 2021-06-27 – 2021-06-30 (×7): 10 mg via ORAL
  Administered 2021-06-30: 5 mg via ORAL
  Administered 2021-06-30 – 2021-07-04 (×9): 10 mg via ORAL
  Filled 2021-06-24: qty 2
  Filled 2021-06-24: qty 1
  Filled 2021-06-24: qty 2
  Filled 2021-06-24: qty 1
  Filled 2021-06-24 (×9): qty 2
  Filled 2021-06-24: qty 1
  Filled 2021-06-24 (×10): qty 2

## 2021-06-24 MED ORDER — MORPHINE SULFATE (PF) 2 MG/ML IV SOLN
2.0000 mg | INTRAVENOUS | Status: DC | PRN
  Administered 2021-06-25: 4 mg via INTRAVENOUS
  Administered 2021-06-29 – 2021-07-02 (×2): 2 mg via INTRAVENOUS
  Filled 2021-06-24: qty 1
  Filled 2021-06-24: qty 2
  Filled 2021-06-24: qty 1

## 2021-06-24 MED ORDER — CHLORHEXIDINE GLUCONATE CLOTH 2 % EX PADS
6.0000 | MEDICATED_PAD | Freq: Every day | CUTANEOUS | Status: DC
  Administered 2021-06-25 – 2021-06-26 (×3): 6 via TOPICAL

## 2021-06-24 NOTE — Consult Note (Signed)
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 Carlean Jews evaluation. Right ankle pain -- Will check dedicated ankle films    Lisette Abu, PA-C Orthopedic Surgery [phone] 06/24/2021, 9:16 AM

## 2021-06-24 NOTE — Progress Notes (Signed)
OT Cancellation Note  Patient Details Name: Veronica Wells MRN: [id number] DOB: 03-20-1936   Cancelled Treatment:    Reason Eval/Treat Not Completed: Patient not medically ready. Awaiting ortho trauma consult before proceeding with evaluation--to determine surgery or not and WB'ing status.  Golden Circle, OTR/L Acute Rehab Services Pager [phone] Office [phone]    Almon Register 06/24/2021, 7:30 AM
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
## 2021-06-24 NOTE — Progress Notes (Signed)
PT Cancellation Note  Patient Details Name: Veronica Wells Der Veronica Wells MRN: [id number] DOB: [birth year]   Cancelled Treatment:    Reason Eval/Treat Not Completed: Patient not medically ready - plan for pelvic ORIF today, will check back post-operatively.  Stacie Glaze, PT DPT Acute Rehabilitation Services Pager [phone]  Office [phone]    Louis Matte 06/24/2021, 10:06 AM

## 2021-06-25 ENCOUNTER — Encounter (HOSPITAL_COMMUNITY): Admission: EM | Disposition: A | Discharge status: Skilled Nursing Facility | Payer: Self-pay | Source: Home / Self Care

## 2021-06-25 ENCOUNTER — Inpatient Hospital Stay (HOSPITAL_COMMUNITY): Payer: Medicare PPO

## 2021-06-25 ENCOUNTER — Inpatient Hospital Stay (HOSPITAL_COMMUNITY): Payer: Medicare PPO | Admitting: Anesthesiology

## 2021-06-25 ENCOUNTER — Encounter (HOSPITAL_COMMUNITY): Payer: Self-pay

## 2021-06-25 HISTORY — PX: ORIF PELVIC FRACTURE: SHX2128

## 2021-06-25 LAB — POCT I-STAT, CHEM 8
BUN: 28 mg/dL — ABNORMAL HIGH (ref 8–23)
Calcium, Ion: 1.12 mmol/L — ABNORMAL LOW (ref 1.15–1.40)
Chloride: 104 mmol/L (ref 98–111)
Creatinine, Ser: 0.9 mg/dL (ref 0.44–1.00)
Glucose, Bld: 149 mg/dL — ABNORMAL HIGH (ref 70–99)
HCT: 23 % — ABNORMAL LOW (ref 36.0–46.0)
Hemoglobin: 7.8 g/dL — ABNORMAL LOW (ref 12.0–15.0)
Potassium: 4 mmol/L (ref 3.5–5.1)
Sodium: 136 mmol/L (ref 135–145)
TCO2: 23 mmol/L (ref 22–32)

## 2021-06-25 LAB — CBC
HCT: 25.7 % — ABNORMAL LOW (ref 36.0–46.0)
Hemoglobin: 8.7 g/dL — ABNORMAL LOW (ref 12.0–15.0)
MCH: 31.5 pg (ref 26.0–34.0)
MCHC: 33.9 g/dL (ref 30.0–36.0)
MCV: 93.1 fL (ref 80.0–100.0)
Platelets: 113 10*3/uL — ABNORMAL LOW (ref 150–400)
RBC: 2.76 MIL/uL — ABNORMAL LOW (ref 3.87–5.11)
RDW: 13.8 % (ref 11.5–15.5)
WBC: 6.7 10*3/uL (ref 4.0–10.5)
nRBC: 0 % (ref 0.0–0.2)

## 2021-06-25 LAB — ABO/RH: ABO/RH(D): O POS

## 2021-06-25 LAB — PREPARE RBC (CROSSMATCH)

## 2021-06-25 LAB — SURGICAL PCR SCREEN
MRSA, PCR: NEGATIVE
Staphylococcus aureus: NEGATIVE

## 2021-06-25 SURGERY — OPEN REDUCTION INTERNAL FIXATION (ORIF) PELVIC FRACTURE
Anesthesia: General | Site: Pelvis | Laterality: Right

## 2021-06-25 MED ORDER — CEFAZOLIN SODIUM-DEXTROSE 2-4 GM/100ML-% IV SOLN
2.0000 g | Freq: Three times a day (TID) | INTRAVENOUS | Status: AC
  Administered 2021-06-25 – 2021-06-26 (×3): 2 g via INTRAVENOUS
  Filled 2021-06-25 (×3): qty 100

## 2021-06-25 MED ORDER — ONDANSETRON HCL 4 MG/2ML IJ SOLN
INTRAMUSCULAR | Status: DC | PRN
  Administered 2021-06-25: 4 mg via INTRAVENOUS

## 2021-06-25 MED ORDER — PROPOFOL 10 MG/ML IV BOLUS
INTRAVENOUS | Status: DC | PRN
  Administered 2021-06-25: 50 mg via INTRAVENOUS

## 2021-06-25 MED ORDER — PHENYLEPHRINE 40 MCG/ML (10ML) SYRINGE FOR IV PUSH (FOR BLOOD PRESSURE SUPPORT)
PREFILLED_SYRINGE | INTRAVENOUS | Status: DC | PRN
  Administered 2021-06-25: 80 ug via INTRAVENOUS
  Administered 2021-06-25: 160 ug via INTRAVENOUS
  Administered 2021-06-25: 80 ug via INTRAVENOUS

## 2021-06-25 MED ORDER — ONDANSETRON HCL 4 MG/2ML IJ SOLN: 4.0000 mg | Freq: Once | INTRAMUSCULAR | Status: DC | PRN

## 2021-06-25 MED ORDER — SUGAMMADEX SODIUM 200 MG/2ML IV SOLN
INTRAVENOUS | Status: DC | PRN
  Administered 2021-06-25: 150 mg via INTRAVENOUS

## 2021-06-25 MED ORDER — FENTANYL CITRATE (PF) 100 MCG/2ML IJ SOLN
INTRAMUSCULAR | Status: AC
  Filled 2021-06-25: qty 2

## 2021-06-25 MED ORDER — ALBUMIN HUMAN 5 % IV SOLN: INTRAVENOUS | Status: DC | PRN

## 2021-06-25 MED ORDER — FENTANYL CITRATE (PF) 250 MCG/5ML IJ SOLN
INTRAMUSCULAR | Status: AC
  Filled 2021-06-25: qty 5

## 2021-06-25 MED ORDER — LACTATED RINGERS IV SOLN: INTRAVENOUS | Status: DC | PRN

## 2021-06-25 MED ORDER — LACTATED RINGERS IV SOLN: INTRAVENOUS | Status: DC

## 2021-06-25 MED ORDER — SODIUM CHLORIDE 0.9 % IV SOLN: 10.0000 mL/h | Freq: Once | INTRAVENOUS | Status: DC

## 2021-06-25 MED ORDER — CEFAZOLIN SODIUM-DEXTROSE 2-4 GM/100ML-% IV SOLN
2.0000 g | INTRAVENOUS | Status: AC
  Administered 2021-06-25: 2 g via INTRAVENOUS
  Filled 2021-06-25: qty 100

## 2021-06-25 MED ORDER — PHENYLEPHRINE HCL-NACL 20-0.9 MG/250ML-% IV SOLN
INTRAVENOUS | Status: DC | PRN
  Administered 2021-06-25: 20 ug/min via INTRAVENOUS

## 2021-06-25 MED ORDER — AMISULPRIDE (ANTIEMETIC) 5 MG/2ML IV SOLN: 5.0000 mg | Freq: Once | INTRAVENOUS | Status: DC | PRN

## 2021-06-25 MED ORDER — ACETAMINOPHEN 325 MG PO TABS
650.0000 mg | ORAL_TABLET | Freq: Four times a day (QID) | ORAL | Status: DC
  Administered 2021-06-26 – 2021-07-04 (×28): 650 mg via ORAL
  Filled 2021-06-25 (×30): qty 2

## 2021-06-25 MED ORDER — LIDOCAINE 2% (20 MG/ML) 5 ML SYRINGE
INTRAMUSCULAR | Status: DC | PRN
  Administered 2021-06-25: 40 mg via INTRAVENOUS

## 2021-06-25 MED ORDER — 0.9 % SODIUM CHLORIDE (POUR BTL) OPTIME
TOPICAL | Status: DC | PRN
  Administered 2021-06-25: 1000 mL

## 2021-06-25 MED ORDER — ACETAMINOPHEN 10 MG/ML IV SOLN
650.0000 mg | Freq: Once | INTRAVENOUS | Status: DC | PRN
  Administered 2021-06-25: 650 mg via INTRAVENOUS

## 2021-06-25 MED ORDER — VANCOMYCIN HCL 1000 MG IV SOLR
INTRAVENOUS | Status: AC
  Filled 2021-06-25: qty 20

## 2021-06-25 MED ORDER — FENTANYL CITRATE (PF) 100 MCG/2ML IJ SOLN
25.0000 ug | INTRAMUSCULAR | Status: DC | PRN
  Administered 2021-06-25: 25 ug via INTRAVENOUS
  Administered 2021-06-25: 50 ug via INTRAVENOUS
  Administered 2021-06-25: 25 ug via INTRAVENOUS

## 2021-06-25 MED ORDER — FENTANYL CITRATE (PF) 250 MCG/5ML IJ SOLN
INTRAMUSCULAR | Status: DC | PRN
  Administered 2021-06-25: 25 ug via INTRAVENOUS
  Administered 2021-06-25: 100 ug via INTRAVENOUS
  Administered 2021-06-25 (×3): 25 ug via INTRAVENOUS

## 2021-06-25 MED ORDER — ACETAMINOPHEN 10 MG/ML IV SOLN
INTRAVENOUS | Status: AC
  Filled 2021-06-25: qty 100

## 2021-06-25 MED ORDER — ROCURONIUM BROMIDE 10 MG/ML (PF) SYRINGE
PREFILLED_SYRINGE | INTRAVENOUS | Status: DC | PRN
Start: 2021-06-25 — End: 2021-06-25
  Administered 2021-06-25: 10 mg via INTRAVENOUS
  Administered 2021-06-25: 20 mg via INTRAVENOUS
  Administered 2021-06-25: 10 mg via INTRAVENOUS
  Administered 2021-06-25: 20 mg via INTRAVENOUS

## 2021-06-25 MED ORDER — DEXAMETHASONE SODIUM PHOSPHATE 10 MG/ML IJ SOLN
INTRAMUSCULAR | Status: DC | PRN
  Administered 2021-06-25: 5 mg via INTRAVENOUS

## 2021-06-25 MED ORDER — CHLORHEXIDINE GLUCONATE 0.12 % MT SOLN
OROMUCOSAL | Status: AC
  Administered 2021-06-25: 15 mL via OROMUCOSAL
  Filled 2021-06-25: qty 15

## 2021-06-25 MED ORDER — TRANEXAMIC ACID-NACL 1000-0.7 MG/100ML-% IV SOLN
1000.0000 mg | INTRAVENOUS | Status: AC
  Administered 2021-06-25: 1000 mg via INTRAVENOUS
  Filled 2021-06-25 (×2): qty 100

## 2021-06-25 MED ORDER — VANCOMYCIN HCL 1000 MG IV SOLR
INTRAVENOUS | Status: DC | PRN
  Administered 2021-06-25: 1000 mg via TOPICAL

## 2021-06-25 MED ORDER — CHLORHEXIDINE GLUCONATE 0.12 % MT SOLN
15.0000 mL | OROMUCOSAL | Status: AC
  Filled 2021-06-25: qty 15

## 2021-06-25 SURGICAL SUPPLY — 63 items
BAG COUNTER SPONGE SURGICOUNT (BAG) ×2 IMPLANT
BAG SPNG CNTER NS LX DISP (BAG) ×1
BIT DRILL 3.5 LONG (BIT) ×1 IMPLANT
BIT DRILL AO MATTA 2.5MX230M (BIT) IMPLANT
BLADE CLIPPER SURG (BLADE) IMPLANT
BRUSH SCRUB EZ PLAIN DRY (MISCELLANEOUS) ×4 IMPLANT
DRAIN CHANNEL 15F RND FF W/TCR (WOUND CARE) IMPLANT
DRAPE C-ARM 42X72 X-RAY (DRAPES) ×2 IMPLANT
DRAPE C-ARMOR (DRAPES) ×2 IMPLANT
DRAPE LAPAROTOMY TRNSV 102X78 (DRAPES) ×2 IMPLANT
DRAPE SURG 17X23 STRL (DRAPES) ×2 IMPLANT
DRAPE U-SHAPE 47X51 STRL (DRAPES) ×2 IMPLANT
DRILL BIT AO MATTA 2.5MX230M (BIT) ×2
DRSG MEPILEX BORDER 4X8 (GAUZE/BANDAGES/DRESSINGS) ×1 IMPLANT
ELECT REM PT RETURN 9FT ADLT (ELECTROSURGICAL) ×2
ELECTRODE REM PT RTRN 9FT ADLT (ELECTROSURGICAL) ×1 IMPLANT
EVACUATOR SILICONE 100CC (DRAIN) IMPLANT
GLOVE SRG 8 PF TXTR STRL LF DI (GLOVE) ×1 IMPLANT
GLOVE SURG ENC MOIS LTX SZ7.5 (GLOVE) ×2 IMPLANT
GLOVE SURG ENC MOIS LTX SZ8 (GLOVE) ×2 IMPLANT
GLOVE SURG UNDER POLY LF SZ7.5 (GLOVE) ×2 IMPLANT
GLOVE SURG UNDER POLY LF SZ8 (GLOVE) ×2
GOWN STRL REUS W/ TWL LRG LVL3 (GOWN DISPOSABLE) ×2 IMPLANT
GOWN STRL REUS W/ TWL XL LVL3 (GOWN DISPOSABLE) ×1 IMPLANT
GOWN STRL REUS W/TWL LRG LVL3 (GOWN DISPOSABLE) ×6
GOWN STRL REUS W/TWL XL LVL3 (GOWN DISPOSABLE) ×4
KIT BASIN OR (CUSTOM PROCEDURE TRAY) ×2 IMPLANT
KIT TURNOVER KIT B (KITS) ×2 IMPLANT
MANIFOLD NEPTUNE II (INSTRUMENTS) ×2 IMPLANT
NS IRRIG 1000ML POUR BTL (IV SOLUTION) ×3 IMPLANT
PACK TOTAL JOINT (CUSTOM PROCEDURE TRAY) ×2 IMPLANT
PAD ARMBOARD 7.5X6 YLW CONV (MISCELLANEOUS) ×4 IMPLANT
PLATE CURVED PRS R88 SM 6-H (Plate) ×1 IMPLANT
PLATE CURVED PRS R88 SM 8-H (Plate) ×1 IMPLANT
PLATE PELV STRT 42.5M 3H (Plate) ×1 IMPLANT
PLATE PELV STRT 58.5M 4H (Plate) ×1 IMPLANT
SCREW CORTEX ST MATTA 3.5X12MM (Screw) ×1 IMPLANT
SCREW CORTEX ST MATTA 3.5X14 (Screw) ×1 IMPLANT
SCREW CORTEX ST MATTA 3.5X16MM (Screw) ×1 IMPLANT
SCREW CORTEX ST MATTA 3.5X18MM (Screw) ×1 IMPLANT
SCREW CORTEX ST MATTA 3.5X22MM (Screw) ×1 IMPLANT
SCREW CORTEX ST MATTA 3.5X24 (Screw) ×1 IMPLANT
SCREW CORTEX ST MATTA 3.5X40MM (Screw) ×1 IMPLANT
SCREW CORTEX ST MATTA 3.5X50MM (Screw) ×1 IMPLANT
SCREW CORTEX ST MATTA 3.5X55MM (Screw) ×1 IMPLANT
SCREW CORTEX ST MATTA 3.5X60MM (Screw) ×2 IMPLANT
SCREW CORTEX ST MATTA 3.5X65MM (Screw) ×1 IMPLANT
SCREW CORTICAL 3.5X42MM (Screw) ×1 IMPLANT
SPONGE T-LAP 18X18 ~~LOC~~+RFID (SPONGE) ×2 IMPLANT
STAPLER VISISTAT 35W (STAPLE) ×2 IMPLANT
SUCTION FRAZIER HANDLE 10FR (MISCELLANEOUS) ×2
SUCTION TUBE FRAZIER 10FR DISP (MISCELLANEOUS) ×1 IMPLANT
SUT ETHILON 2 0 FS 18 (SUTURE) ×1 IMPLANT
SUT VIC AB 0 CT1 27 (SUTURE) ×2
SUT VIC AB 0 CT1 27XBRD ANBCTR (SUTURE) ×2 IMPLANT
SUT VIC AB 1 CT1 18XCR BRD 8 (SUTURE) ×2 IMPLANT
SUT VIC AB 1 CT1 8-18 (SUTURE) ×2
SUT VIC AB 2-0 CT1 27 (SUTURE) ×2
SUT VIC AB 2-0 CT1 TAPERPNT 27 (SUTURE) ×2 IMPLANT
TOWEL GREEN STERILE (TOWEL DISPOSABLE) ×4 IMPLANT
TOWEL GREEN STERILE FF (TOWEL DISPOSABLE) ×2 IMPLANT
TRAY FOLEY MTR SLVR 16FR STAT (SET/KITS/TRAYS/PACK) IMPLANT
WATER STERILE IRR 1000ML POUR (IV SOLUTION) ×4 IMPLANT

## 2021-06-25 NOTE — Progress Notes (Signed)
OT Cancellation Note  Patient Details Name: Veronica Wells MRN: [id number] DOB: [birth year]   Cancelled Treatment:    Reason Eval/Treat Not Completed: Patient not medically ready (pending surgery today) ORIF pelvic  Billey Chang, OTR/L  Acute Rehabilitation Services Pager: [phone] Office: [phone] .  06/25/2021, 9:38 AM

## 2021-06-25 NOTE — OR Nursing (Signed)
Care of patient assumed at 1902.

## 2021-06-25 NOTE — Progress Notes (Signed)
PT Cancellation Note  Patient Details Name: Veronica Wells [name] MRN: [id number] DOB: [birth year]   Cancelled Treatment:    Reason Eval/Treat Not Completed: Patient not medically ready - pt going for procedure today, will check back post-operatively.  Stacie Glaze, PT DPT Acute Rehabilitation Services Pager [phone]  Office [phone]    Louis Matte 06/25/2021, 9:38 AM
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
## 2021-06-25 NOTE — Transfer of Care (Signed)
Immediate Anesthesia Transfer of Care Note  Patient: Veronica Wells [name]  Procedure(s) Performed: OPEN REDUCTION INTERNAL FIXATION (ORIF) PELVIC FRACTURE (Right: Pelvis)  Patient Location: PACU  Anesthesia Type:General  Level of Consciousness: awake, alert  and oriented  Airway & Oxygen Therapy: Patient Spontanous Breathing and Patient connected to nasal cannula oxygen  Post-op Assessment: Report given to RN and Post -op Vital signs reviewed and stable  Post vital signs: Reviewed and stable  Last Vitals:  Vitals Value Taken Time  BP 144/64 06/25/21 2040  Temp 36.4 C 06/25/21 2040  Pulse 81 06/25/21 2042  Resp 15 06/25/21 2042  SpO2 98 % 06/25/21 2042  Vitals shown include unvalidated device data.  Last Pain:  Vitals:   06/25/21 1300  TempSrc: Oral  PainSc:          Complications: No notable events documented.

## 2021-06-25 NOTE — Anesthesia Preprocedure Evaluation (Addendum)
Anesthesia Evaluation  Patient identified by MRN, date of birth, ID band Patient awake    Reviewed: Allergy & Precautions, NPO status , Patient's Chart, lab work & pertinent test results  Airway Mallampati: II  TM Distance: >3 FB Neck ROM: Full    Dental no notable dental hx.    Pulmonary former smoker,    Pulmonary exam normal breath sounds clear to auscultation       Cardiovascular negative cardio ROS Normal cardiovascular exam Rhythm:Regular Rate:Normal     Neuro/Psych negative neurological ROS  negative psych ROS   GI/Hepatic negative GI ROS, Neg liver ROS,   Endo/Other  negative endocrine ROS  Renal/GU Renal InsufficiencyRenal disease     Musculoskeletal negative musculoskeletal ROS (+)   Abdominal   Peds  Hematology  (+) anemia ,   Anesthesia Other Findings Fracture of pelvis  Reproductive/Obstetrics                            Anesthesia Physical Anesthesia Plan  ASA: 2  Anesthesia Plan: General   Post-op Pain Management:    Induction: Intravenous  PONV Risk Score and Plan: 3 and Ondansetron, Dexamethasone and Treatment may vary due to age or medical condition  Airway Management Planned: Oral ETT  Additional Equipment:   Intra-op Plan:   Post-operative Plan: Extubation in OR  Informed Consent: I have reviewed the patients History and Physical, chart, labs and discussed the procedure including the risks, benefits and alternatives for the proposed anesthesia with the patient or authorized representative who has indicated his/her understanding and acceptance.     Dental advisory given  Plan Discussed with: CRNA  Anesthesia Plan Comments:         Anesthesia Quick Evaluation

## 2021-06-25 NOTE — Anesthesia Procedure Notes (Signed)
Procedure Name: Intubation Date/Time: 06/25/2021 5:58 PM Performed by: Yehuda Mao, CRNA Pre-anesthesia Checklist: Patient identified, Emergency Drugs available, Suction available and Patient being monitored Patient Re-evaluated:Patient Re-evaluated prior to induction Oxygen Delivery Method: Circle system utilized Preoxygenation: Pre-oxygenation with 100% oxygen Induction Type: IV induction Ventilation: Mask ventilation without difficulty Laryngoscope Size: Mac and 3 Grade View: Grade I Tube type: Oral Tube size: 7.0 mm Number of attempts: 1 Airway Equipment and Method: Stylet Placement Confirmation: ETT inserted through vocal cords under direct vision, positive ETCO2 and breath sounds checked- equal and bilateral Secured at: 21 cm Tube secured with: Tape Dental Injury: Teeth and Oropharynx as per pre-operative assessment

## 2021-06-26 LAB — CBC
HCT: 30.2 % — ABNORMAL LOW (ref 36.0–46.0)
Hemoglobin: 10.3 g/dL — ABNORMAL LOW (ref 12.0–15.0)
MCH: 31.1 pg (ref 26.0–34.0)
MCHC: 34.1 g/dL (ref 30.0–36.0)
MCV: 91.2 fL (ref 80.0–100.0)
Platelets: 80 10*3/uL — ABNORMAL LOW (ref 150–400)
RBC: 3.31 MIL/uL — ABNORMAL LOW (ref 3.87–5.11)
RDW: 14.5 % (ref 11.5–15.5)
WBC: 8 10*3/uL (ref 4.0–10.5)
nRBC: 0 % (ref 0.0–0.2)

## 2021-06-26 LAB — TYPE AND SCREEN
ABO/RH(D): O POS
Antibody Screen: NEGATIVE
Unit division: 0
Unit division: 0

## 2021-06-26 LAB — BPAM RBC
Blood Product Expiration Date: 202210122359
Blood Product Expiration Date: 202210122359
ISSUE DATE / TIME: 202209131913
ISSUE DATE / TIME: 202209131913
Unit Type and Rh: 5100
Unit Type and Rh: 5100

## 2021-06-26 MED ORDER — WHITE PETROLATUM EX OINT
TOPICAL_OINTMENT | CUTANEOUS | Status: AC
  Filled 2021-06-26: qty 28.35

## 2021-06-26 MED ORDER — POLYETHYLENE GLYCOL 3350 17 G PO PACK: 17.0000 g | PACK | Freq: Every day | ORAL | Status: DC | PRN
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
## 2021-06-26 NOTE — Care Management Important Message (Signed)
Important Message  Patient Details  Name: Veronica Wells Veronica Wells MRN: [id number] Date of Birth: 1936/03/06   Medicare Important Message Given:  Yes     [name] 06/26/2021, 4:18 PM
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
## 2021-06-26 NOTE — Progress Notes (Signed)
? ?  Inpatient Rehab Admissions Coordinator : ? ?Per therapy recommendations, patient was screened for CIR candidacy by Orena Lus Kriegel RN MSN.  At this time patient appears to be a potential candidate for CIR. I will place a rehab consult per protocol for full assessment. Please call me with any questions. ? ?Tylyn Rossi Burdo RN MSN ?Admissions Coordinator ?[phone] ?  ?

## 2021-06-26 NOTE — Care Management Important Message (Signed)
Important Message  Patient Details  Name: Veronica Wells MRN: [id number] Date of Birth: 22-Feb-1936   Medicare Important Message Given:  Yes     [name] 06/26/2021, 4:11 PM

## 2021-06-27 LAB — CBC
HCT: 29.7 % — ABNORMAL LOW (ref 36.0–46.0)
Hemoglobin: 9.4 g/dL — ABNORMAL LOW (ref 12.0–15.0)
MCH: 29.3 pg (ref 26.0–34.0)
MCHC: 31.6 g/dL (ref 30.0–36.0)
MCV: 92.5 fL (ref 80.0–100.0)
Platelets: 139 10*3/uL — ABNORMAL LOW (ref 150–400)
RBC: 3.21 MIL/uL — ABNORMAL LOW (ref 3.87–5.11)
RDW: 15.2 % (ref 11.5–15.5)
WBC: 5.7 10*3/uL (ref 4.0–10.5)
nRBC: 0 % (ref 0.0–0.2)

## 2021-06-27 LAB — VITAMIN D 25 HYDROXY (VIT D DEFICIENCY, FRACTURES): Vit D, 25-Hydroxy: 35.45 ng/mL (ref 30–100)

## 2021-06-27 MED ORDER — BISACODYL 10 MG RE SUPP: 10.0000 mg | Freq: Every day | RECTAL | Status: DC | PRN

## 2021-06-27 MED ORDER — POLYETHYLENE GLYCOL 3350 17 G PO PACK
17.0000 g | PACK | Freq: Once | ORAL | Status: AC
  Administered 2021-06-27: 17 g via ORAL
  Filled 2021-06-27: qty 1

## 2021-06-27 MED ORDER — ENOXAPARIN SODIUM 30 MG/0.3ML IJ SOSY
30.0000 mg | PREFILLED_SYRINGE | INTRAMUSCULAR | Status: DC
  Administered 2021-06-27 – 2021-07-04 (×8): 30 mg via SUBCUTANEOUS
  Filled 2021-06-27 (×8): qty 0.3
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
## 2021-06-27 NOTE — Anesthesia Postprocedure Evaluation (Signed)
Anesthesia Post Note  Patient: Veronica Wells  Procedure(s) Performed: OPEN REDUCTION INTERNAL FIXATION (ORIF) PELVIC FRACTURE (Right: Pelvis)     Patient location during evaluation: PACU Anesthesia Type: General Level of consciousness: sedated and patient cooperative Pain management: pain level controlled Vital Signs Assessment: post-procedure vital signs reviewed and stable Respiratory status: spontaneous breathing Cardiovascular status: stable Anesthetic complications: no   No notable events documented.  Last Vitals:  Vitals:   06/27/21 0740 06/27/21 1142  BP: 119/66 122/66  Pulse: 62 73  Resp: 14 16  Temp: 36.6 C 36.9 C  SpO2: 97% 90%    Last Pain:  Vitals:   06/27/21 1200  TempSrc:   PainSc: 0-No pain                 Nolon Nations

## 2021-06-27 NOTE — TOC Progression Note (Signed)
Transition of Care [facility]) - Progression Note    Patient Details  Name: Veronica Wells MRN: [id number] Date of Birth: September 28, 1936  [facility] CM/SW Contact  Oren Section Cleta Alberts, RN Phone Number: 06/27/2021, 1:49 PM  Clinical Narrative:    Spoke with admissions department at [facility] regarding potential SNF admission.  Representative states that patient has paid a deposit and is on the list for [facility], but is not eligible for rehab bed unless she is a resident there.  Explained this to patient, and she understands; she agrees to move forward with possible inpatient rehab, pending insurance authorization.  Will plan SNF as a backup plan to CIR.  Expected Discharge Plan: IP Rehab Facility Barriers to Discharge: Continued Medical Work up  Expected Discharge Plan and Services Expected Discharge Plan: [place]   Discharge Planning Services: CM Consult Post Acute Care Choice: IP Rehab Living arrangements for the past 2 months: Single Family Home                                       Social Determinants of Health (SDOH) Interventions    Readmission Risk Interventions No flowsheet data found.  Reinaldo Raddle, RN, BSN  Trauma/Neuro ICU Case Manager [phone]

## 2021-06-27 NOTE — Progress Notes (Signed)
Inpatient Rehab Admissions Coordinator:   Met with patient at bedside to discuss recommendations for CIR as well as goals/expectations of potential CIR stay.  I expect she would reach supervision to mod I level with a stay of about 2 weeks, given current level of function.  She plans to d/c home with her husband who is retired.  I let her know that I would need to get insurance authorization from [facility] and I'm not confident that I can get approval with her diagnoses.  I will do my best, and send it out today.   Shann Medal, PT, DPT Admissions Coordinator [phone] 06/27/21  12:48 PM

## 2021-06-28 ENCOUNTER — Encounter (HOSPITAL_COMMUNITY): Payer: Self-pay | Admitting: Orthopedic Surgery

## 2021-06-28 LAB — CBC
HCT: 29.4 % — ABNORMAL LOW (ref 36.0–46.0)
Hemoglobin: 10.1 g/dL — ABNORMAL LOW (ref 12.0–15.0)
MCH: 31.7 pg (ref 26.0–34.0)
MCHC: 34.4 g/dL (ref 30.0–36.0)
MCV: 92.2 fL (ref 80.0–100.0)
Platelets: 180 10*3/uL (ref 150–400)
RBC: 3.19 MIL/uL — ABNORMAL LOW (ref 3.87–5.11)
RDW: 14.5 % (ref 11.5–15.5)
WBC: 6.3 10*3/uL (ref 4.0–10.5)
nRBC: 0 % (ref 0.0–0.2)

## 2021-06-28 MED ORDER — POLYETHYLENE GLYCOL 3350 17 G PO PACK
17.0000 g | PACK | Freq: Two times a day (BID) | ORAL | Status: DC
  Administered 2021-06-28 – 2021-07-04 (×10): 17 g via ORAL
  Filled 2021-06-28 (×13): qty 1

## 2021-06-28 MED ORDER — BISACODYL 10 MG RE SUPP
10.0000 mg | Freq: Every day | RECTAL | Status: DC
  Administered 2021-06-28: 10 mg via RECTAL
  Filled 2021-06-28 (×3): qty 1
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
## 2021-06-28 NOTE — Progress Notes (Signed)
Inpatient Rehab Admissions Coordinator:   Awaiting determination from [facility] regarding prior auth request for CIR.    Shann Medal, PT, DPT Admissions Coordinator [phone] 06/28/21  1:48 PM

## 2021-06-28 NOTE — Progress Notes (Signed)
Orthopaedic Trauma Service (OTS)  3 Days Post-Op Procedure(s) (LRB): OPEN REDUCTION INTERNAL FIXATION (ORIF) PELVIC FRACTURE (Right)  Subjective: Patient reports pain as moderate and severe.  Pleasant as always and awaiting decision re Rehab.  Objective: Current Vitals Blood pressure (!) 148/79, pulse 69, temperature 98.4 F (36.9 C), temperature source Oral, resp. rate 17, height '4\' 9"'$  (1.448 m), weight 44 kg, SpO2 97 %. Vital signs in last 24 hours: Temp:  [97.7 F (36.5 C)-98.8 F (37.1 C)] 98.4 F (36.9 C) (09/16 1137) Pulse Rate:  [61-76] 69 (09/16 1137) Resp:  [14-20] 17 (09/16 1137) BP: (109-148)/(58-79) 148/79 (09/16 1137) SpO2:  [91 %-97 %] 97 % (09/16 1137)  Intake/Output from previous day: 09/15 0701 - 09/16 0700 In: 480 [P.O.:480] Out: 0   LABS Recent Labs    06/25/21 2006 06/26/21 0051 06/27/21 0355 06/28/21 0129  HGB 7.8* 10.3* 9.4* 10.1*   Recent Labs    06/27/21 0355 06/28/21 0129  WBC 5.7 6.3  RBC 3.21* 3.19*  HCT 29.7* 29.4*  PLT 139* 180   Recent Labs    06/25/21 2006  NA 136  K 4.0  CL 104  BUN 28*  CREATININE 0.90  GLUCOSE 149*   No results for input(s): LABPT, INR in the last 72 hours.   Physical Exam RLE  Dressing intact, clean, dry  Edema/ swelling controlled   Assessment/Plan: 3 Days Post-Op Procedure(s) (LRB): OPEN REDUCTION INTERNAL FIXATION (ORIF) PELVIC FRACTURE (Right) 1. PT/OT  2. DVT proph Lovenox 3. F/u 8-14 days  Altamese Hollenberg, MD [facility], [facility] [phone]
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
## 2021-07-01 LAB — CBC
HCT: 29.9 % — ABNORMAL LOW (ref 36.0–46.0)
Hemoglobin: 10 g/dL — ABNORMAL LOW (ref 12.0–15.0)
MCH: 31.4 pg (ref 26.0–34.0)
MCHC: 33.4 g/dL (ref 30.0–36.0)
MCV: 94 fL (ref 80.0–100.0)
Platelets: 365 10*3/uL (ref 150–400)
RBC: 3.18 MIL/uL — ABNORMAL LOW (ref 3.87–5.11)
RDW: 14.2 % (ref 11.5–15.5)
WBC: 5.7 10*3/uL (ref 4.0–10.5)
nRBC: 0 % (ref 0.0–0.2)
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
## 2021-07-01 NOTE — Progress Notes (Addendum)
Inpatient Rehab Admissions Coordinator:   Called UR supervisor at [facility] and left a message to check on status of case.   1246: [employer] wanting a peer to peer review for CIR request.  Gave coordinator number for Lucky Rathke [phone]) to Dr. Grandville Silos.  Shann Medal, PT, DPT Admissions Coordinator [phone] 07/01/21  9:23 AM
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
## 2021-07-01 NOTE — TOC Progression Note (Signed)
Transition of Care [facility]) - Progression Note    Patient Details  Name: Veronica Wells MRN: [id number] Date of Birth: 01-Jul-1936  [facility] CM/SW Contact  Ella Bodo, RN Phone Number: 07/01/2021, 1530  Clinical Narrative:    Noted inpatient rehab denial by insurance carrier; peer to peer in progress.  Anticipate denial of peer-to-peer; met with patient, and she is agreeable to being faxed out for SNF placement as backup plan.  Will initiate FL -2 and fax out patient information for bed search.   Expected Discharge Plan: IP Rehab Facility Barriers to Discharge: Continued Medical Work up  Expected Discharge Plan and Services Expected Discharge Plan: [place]   Discharge Planning Services: CM Consult Post Acute Care Choice: IP Rehab Living arrangements for the past 2 months: Single Family Home                                       Social Determinants of Health (SDOH) Interventions    Readmission Risk Interventions No flowsheet data found.  Reinaldo Raddle, RN, BSN  Trauma/Neuro ICU Case Manager [phone]

## 2021-07-02 DIAGNOSIS — S3282XA Multiple fractures of pelvis without disruption of pelvic ring, initial encounter for closed fracture: Secondary | ICD-10-CM | POA: Diagnosis present

## 2021-07-02 DIAGNOSIS — Z9889 Other specified postprocedural states: Secondary | ICD-10-CM

## 2021-07-02 DIAGNOSIS — Z96649 Presence of unspecified artificial hip joint: Secondary | ICD-10-CM

## 2021-07-02 LAB — RESP PANEL BY RT-PCR (FLU A&B, COVID) ARPGX2
Influenza A by PCR: NEGATIVE
Influenza B by PCR: NEGATIVE
SARS Coronavirus 2 by RT PCR: NEGATIVE

## 2021-07-02 MED ORDER — DIPHENHYDRAMINE HCL 25 MG PO CAPS: 25.0000 mg | ORAL_CAPSULE | Freq: Four times a day (QID) | ORAL | Status: DC | PRN

## 2021-07-02 NOTE — TOC Progression Note (Addendum)
Transition of Care [facility]) - Progression Note    Patient Details  Name: Veronica Wells MRN: [id number] Date of Birth: 04-16-1936  [facility]) CM/SW Contact  Oren Section Cleta Alberts, RN Phone Number: 07/02/2021, 10:41 AM  Clinical Narrative:    Continue to await peer to peer review with insurance payor.  I spoke with patient's daughter-in-law, Veronica Wells, this morning to discuss other options, should CIR be denied.  Veronica Wells states they would like to consider skilled nursing facility, and prefer [name].  She states she plans to call facility this morning.  Addendum: 2:13 PM Notified by [name] at [facility], that patient has been accepted for admission to facility.  SNF would have bed available tomorrow, if needed.  Notified PA of need for COVID testing.  Will follow with updates as available.  Addendum: 4:20 PM Patient denied by insurance for inpatient rehab.  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  Expected Discharge Plan: IP Rehab Facility Barriers to Discharge: Continued Medical Work up  Expected Discharge Plan and Services Expected Discharge Plan: [place]   Discharge Planning Services: CM Consult Post Acute Care Choice: IP Rehab Living arrangements for the past 2 months: Single Family Home                                       Social Determinants of Health (SDOH) Interventions    Readmission Risk Interventions No flowsheet data found.  Reinaldo Raddle, RN, BSN  Trauma/Neuro ICU Case Manager [phone]
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
## 2021-07-02 NOTE — Progress Notes (Signed)
Inpatient Rehab Admissions Coordinator:   Notified by [facility] that request for CIR has been denied.  Pt will need to f/u at lower level of care and note that she has been accepted at [facility].  I will sign off at this time.   Shann Medal, PT, DPT Admissions Coordinator [phone] 07/02/21  4:21 PM

## 2021-07-02 NOTE — Progress Notes (Signed)
Patient ID: Veronica Wells, female   DOB: 10-26-1935, 85 y.o.   MRN: [id number] I did a peer to peer call with [facility]. They still deny coverage for CIR. Plan SNF.  Georganna Skeans, MD, MPH, FACS Please use [email or web address] to contact on call provider
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
## 2021-07-03 MED ORDER — OXYCODONE HCL 5 MG PO TABS: 10.0000 mg | ORAL_TABLET | Freq: Four times a day (QID) | ORAL | 0 refills | Status: AC | PRN

## 2021-07-03 MED ORDER — OXYCODONE HCL 5 MG PO TABS: 10.0000 mg | ORAL_TABLET | Freq: Four times a day (QID) | ORAL | 0 refills | Status: DC | PRN

## 2021-07-03 MED ORDER — ENOXAPARIN SODIUM 30 MG/0.3ML IJ SOSY: 30.0000 mg | PREFILLED_SYRINGE | INTRAMUSCULAR | 0 refills | Status: DC
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
## 2021-07-03 NOTE — TOC Progression Note (Signed)
Transition of Care [facility]) - Progression Note    Patient Details  Name: Veronica Wells MRN: [id number] Date of Birth: May 26, 1936  [facility] CM/SW Contact  Oren Section Cleta Alberts, RN Phone Number: 07/03/2021, 3:55 PM  Clinical Narrative:    Received notification from [facility] [place] that patient has been approved by insurance for admission to their facility.  Bed available on Thursday, September 22; will plan for morning discharge with transport via [facility].  Updated patient and daughter, Veronica Wells.   Expected Discharge Plan: Skilled Nursing Facility Barriers to Discharge: Barriers Resolved  Expected Discharge Plan and Services Expected Discharge Plan: [place]   Discharge Planning Services: CM Consult Post Acute Care Choice: IP Rehab Living arrangements for the past 2 months: Single Family Home                                       Social Determinants of Health (SDOH) Interventions    Readmission Risk Interventions No flowsheet data found.  Reinaldo Raddle, RN, BSN  Trauma/Neuro ICU Case Manager [phone]
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
## 2021-07-04 MED ORDER — DOCUSATE SODIUM 100 MG PO CAPS: 100.0000 mg | ORAL_CAPSULE | Freq: Two times a day (BID) | ORAL | 0 refills | Status: AC

## 2021-07-04 MED ORDER — ACETAMINOPHEN 325 MG PO TABS: 650.0000 mg | ORAL_TABLET | Freq: Four times a day (QID) | ORAL | 0 refills | Status: AC

## 2021-07-04 MED ORDER — POLYETHYLENE GLYCOL 3350 17 G PO PACK: 17.0000 g | PACK | Freq: Every day | ORAL | 0 refills | Status: AC | PRN

## 2021-07-04 NOTE — Progress Notes (Signed)
Pt with discharge orders, discharge paperwork and prescription given to transport, all questions answered. IV's removed.. Pt given all equipment and transported via [place].

## 2021-07-04 NOTE — Care Management Important Message (Signed)
Important Message  Patient Details  Name: Veronica Wells MRN: [id number] Date of Birth: 07-28-1936   Medicare Important Message Given:  Yes     [name] 07/04/2021, 3:22 PM

## 2021-07-04 NOTE — Progress Notes (Signed)
Report called to [phone] given to Crist Fat, RN

## 2021-07-04 NOTE — TOC Transition Note (Signed)
Transition of Care [facility]) - CM/SW Discharge Note   Patient Details  Name: Modine Oppenheimer [name] MRN: [id number] Date of Birth: February 05, 1936  [facility]) CM/SW Contact:  Ella Bodo, RN Phone Number: 07/04/2021, 10:56 AM   Clinical Narrative:    Pt medically stable for discharge today, and insurance auth received for admission to [facility] SN[facility].  Patient discharging to room 114; bedside nurse to call report to [phone], ask for [facility].  [facility] notified for transport at 10:58am.     Final next level of care: Skilled Nursing Facility Barriers to Discharge: Barriers Resolved   Patient Goals and CMS Choice Patient states their goals for this hospitalization and ongoing recovery are:: to go home CMS Medicare.gov Compare Post Acute Care list provided to:: Patient Choice offered to / list presented to : Patient  Discharge Placement PASRR number recieved: 07/01/21            Patient chooses bed at: [facility] Patient to be transferred to facility by: [place] Name of family member notified: Stanton Kidney, daughter in law Patient and family notified of of transfer: 07/04/21  Discharge Plan and Services   Discharge Planning Services: CM Consult Post Acute Care Choice: IP Rehab                               Social Determinants of Health (SDOH) Interventions     Readmission Risk Interventions No flowsheet data found.  Reinaldo Raddle, RN, BSN  Trauma/Neuro ICU Case Manager [phone]
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
## 2022-02-28 ENCOUNTER — Telehealth: Payer: Self-pay

## 2022-02-28 NOTE — Telephone Encounter (Signed)
I received a call from [facility] with [facility] [place] calls and she preformed a Quantaflo exam on the pt and she noted that the pt had moderate decrease in circulation in both lower extremities. Heather wanted to inform PCP in case provider wanted to order additional labs.

## 2022-03-03 NOTE — Telephone Encounter (Signed)
Left a message for the pt to return my call.  

## 2022-03-05 NOTE — Telephone Encounter (Signed)
Pt has been added to schedule and stated she would bring results for herself and husband.

## 2022-03-12 ENCOUNTER — Encounter: Payer: Self-pay | Admitting: Family Medicine

## 2022-03-12 ENCOUNTER — Ambulatory Visit (INDEPENDENT_AMBULATORY_CARE_PROVIDER_SITE_OTHER): Payer: Medicare Other | Admitting: Family Medicine

## 2022-03-12 VITALS — BP 138/76 | HR 58 | Temp 97.8°F | Ht <= 58 in | Wt 97.2 lb

## 2022-03-12 DIAGNOSIS — R2232 Localized swelling, mass and lump, left upper limb: Secondary | ICD-10-CM | POA: Diagnosis not present

## 2022-03-12 DIAGNOSIS — D649 Anemia, unspecified: Secondary | ICD-10-CM | POA: Diagnosis not present

## 2022-03-12 DIAGNOSIS — Z23 Encounter for immunization: Secondary | ICD-10-CM | POA: Diagnosis not present

## 2022-03-12 DIAGNOSIS — N179 Acute kidney failure, unspecified: Secondary | ICD-10-CM | POA: Diagnosis not present

## 2022-03-12 NOTE — Patient Instructions (Signed)
Keep up your regular exercise  We will call with lab results.

## 2022-03-12 NOTE — Addendum Note (Signed)
Addended by: Nilda Riggs on: 03/12/2022 04:28 PM   Modules accepted: Orders
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
## 2022-03-13 LAB — BASIC METABOLIC PANEL
BUN: 39 mg/dL — ABNORMAL HIGH (ref 6–23)
CO2: 27 mEq/L (ref 19–32)
Calcium: 9.8 mg/dL (ref 8.4–10.5)
Chloride: 102 mEq/L (ref 96–112)
Creatinine, Ser: 1.06 mg/dL (ref 0.40–1.20)
GFR: 48.56 mL/min — ABNORMAL LOW (ref >60.00)
Glucose, Bld: 83 mg/dL (ref 70–99)
Potassium: 5 mEq/L (ref 3.5–5.1)
Sodium: 139 mEq/L (ref 135–145)

## 2022-03-13 LAB — CBC WITH DIFFERENTIAL/PLATELET
Basophils Absolute: 0 10*3/uL (ref 0.0–0.1)
Basophils Relative: 0.6 % (ref 0.0–3.0)
Eosinophils Absolute: 0.2 10*3/uL (ref 0.0–0.7)
Eosinophils Relative: 3 % (ref 0.0–5.0)
HCT: 41.9 % (ref 36.0–46.0)
Hemoglobin: 13.9 g/dL (ref 12.0–15.0)
Lymphocytes Relative: 24.4 % (ref 12.0–46.0)
Lymphs Abs: 1.5 10*3/uL (ref 0.7–4.0)
MCHC: 33 g/dL (ref 30.0–36.0)
MCV: 94.3 fl (ref 78.0–100.0)
Monocytes Absolute: 0.6 10*3/uL (ref 0.1–1.0)
Monocytes Relative: 8.9 % (ref 3.0–12.0)
Neutro Abs: 4 10*3/uL (ref 1.4–7.7)
Neutrophils Relative %: 63.1 % (ref 43.0–77.0)
Platelets: 270 10*3/uL (ref 150.0–400.0)
RBC: 4.45 Mil/uL (ref 3.87–5.11)
RDW: 14.1 % (ref 11.5–15.5)
WBC: 6.3 10*3/uL (ref 4.0–10.5)

## 2022-03-31 ENCOUNTER — Telehealth: Payer: Self-pay | Admitting: Family Medicine

## 2022-03-31 NOTE — Telephone Encounter (Signed)
Pt call and stated she want dr.Burchette to give her the name of the surgeon and want a call back because she need one.

## 2022-03-31 NOTE — Telephone Encounter (Signed)
Pt informed of the message and verbalized understanding  

## 2022-04-16 ENCOUNTER — Telehealth: Payer: Self-pay | Admitting: Family Medicine

## 2022-04-16 DIAGNOSIS — D179 Benign lipomatous neoplasm, unspecified: Secondary | ICD-10-CM

## 2022-04-16 NOTE — Telephone Encounter (Signed)
Veronica Wells with [facility] is calling and pt would like a referral for lipoma. There is no referral in place

## 2022-04-17 NOTE — Telephone Encounter (Signed)
Referral entered as below.  

## 2022-05-14 DIAGNOSIS — D1722 Benign lipomatous neoplasm of skin and subcutaneous tissue of left arm: Secondary | ICD-10-CM | POA: Diagnosis not present

## 2022-06-05 ENCOUNTER — Other Ambulatory Visit: Payer: Self-pay | Admitting: General Surgery

## 2022-06-05 DIAGNOSIS — D171 Benign lipomatous neoplasm of skin and subcutaneous tissue of trunk: Secondary | ICD-10-CM | POA: Diagnosis not present

## 2022-06-25 ENCOUNTER — Ambulatory Visit (INDEPENDENT_AMBULATORY_CARE_PROVIDER_SITE_OTHER): Payer: Medicare HMO

## 2022-06-25 ENCOUNTER — Ambulatory Visit: Payer: Medicare PPO

## 2022-06-25 VITALS — Ht <= 58 in | Wt 97.0 lb

## 2022-06-25 DIAGNOSIS — Z0000 Encounter for general adult medical examination without abnormal findings: Secondary | ICD-10-CM | POA: Diagnosis not present

## 2022-06-25 NOTE — Progress Notes (Signed)
Subjective:   Veronica Wells is a 86 y.o. female who presents for Medicare Annual (Subsequent) preventive examination.  Review of Systems    Virtual Visit via Telephone Note  I connected with  Veronica Wells on 06/25/22 at  3:15 PM EDT by telephone and verified that I am speaking with the correct person using two identifiers.  Location: Patient: Home Provider: Office Persons participating in the virtual visit: patient/Nurse Health Advisor   I discussed the limitations, risks, security and privacy concerns of performing an evaluation and management service by telephone and the availability of in person appointments. The patient expressed understanding and agreed to proceed.  Interactive audio and video telecommunications were attempted between this nurse and patient, however failed, due to patient having technical difficulties OR patient did not have access to video capability.  We continued and completed visit with audio only.  Some vital signs may be absent or patient reported.   Veronica Peaches, LPN  Cardiac Risk Factors include: advanced age (>27mn, >>30women)     Objective:    Today's Vitals   06/25/22 1519  Weight: 97 lb (44 kg)  Height: '4\' 9"'$  (1.448 m)   Body mass index is 20.99 kg/m.     06/25/2022    3:30 PM 06/25/2021    3:16 PM 06/23/2021   10:00 PM 06/05/2021    3:08 PM 05/29/2020    3:23 PM 07/13/2018   12:06 PM 07/07/2018    1:49 PM  Advanced Directives  Does Patient Have a Medical Advance Directive? Yes No No Yes Yes Yes Yes  Type of AParamed[place]of [place] CenterLiving will   Living will Living will [place] will [place] will  Does patient want to make changes to medical advance directive?      No - Patient declined No - Patient declined  Copy of [place]? No - copy requested    Yes - validated most recent copy scanned in chart (See  row information) No - copy requested No - copy requested  Would patient like information on creating a medical advance directive?  No - Patient declined No - Patient declined        Current Medications (verified) Outpatient Encounter Medications as of 06/25/2022  Medication Sig   Calcium Carbonate-Vitamin D (CALCIUM + D PO) Take 1 tablet by mouth daily.    calcium-vitamin D (OSCAL WITH D) 250-125 MG-UNIT tablet Take 1 tablet by mouth daily.   Omega-3 Fatty Acids (OMEGA 3 PO) Take 1 capsule by mouth daily.   Red Yeast Rice Extract (RED YEAST RICE PO) Take by mouth.   No facility-administered encounter medications on file as of 06/25/2022.    Allergies (verified) Patient has no known allergies.   History: Past Medical History:  Diagnosis Date   Arthritis    Colon polyps    Difficulty sleeping    Environmental allergies    Headaches, cluster    Hemorrhoids    HLD (hyperlipidemia)    [place]   melanoma on back right arm   Osteopenia    Past Surgical History:  Procedure Laterality Date   BILATERAL ANTERIOR TOTAL HIP ARTHROPLASTY Bilateral    KNEE SURGERY Right 10/13/2009   Replacement/right knee   MELANOMA EXCISION  10/13/2001   ORIF PELVIC FRACTURE Right 06/25/2021   Procedure: OPEN REDUCTION INTERNAL FIXATION (ORIF) PELVIC FRACTURE;  Surgeon: HAltamese [facility] MD;  [facility];  Service: Orthopedics;  Laterality: Right;   REPLACEMENT TOTAL KNEE Right    2010   TOTAL HIP ARTHROPLASTY Right 06/19/2015   Procedure: RIGHT TOTAL HIP ARTHROPLASTY ANTERIOR APPROACH;  Surgeon: Paralee Cancel, MD;  Location: [facility];  Service: Orthopedics;  Laterality: Right;   TOTAL HIP ARTHROPLASTY Left 07/13/2018   Procedure: LEFT TOTAL HIP ARTHROPLASTY ANTERIOR APPROACH;  Surgeon: Paralee Cancel, MD;  Location: [facility];  Service: Orthopedics;  Laterality: Left;  10mn   TUBAL LIGATION     Family History  Problem Relation Age of Onset   Cancer - Colon Mother 829      Colon    Osteoporosis Mother    Heart disease Father    Heart failure Father    Asthma Son    Colon polyps Son    Osteoporosis Maternal Aunt    Breast cancer Paternal A55       AAge5's  Breast cancer Paternal A24       Age 84's  Breast cancer Cousin        Age [id number]'s  Stomach cancer Neg Hx    Social History   Socioeconomic History   Marital status: Married    Spouse name: JLenord FellersDer LCharna Wells  Number of children: 3   Years of education: 16   Highest education level: Bachelor's degree (e.g., BA, AB, BS)  Occupational History   Occupation: retired   Occupation: tPharmacist, hospital Tobacco Use   Smoking status: Former    Packs/day: 0.50    Types: Cigarettes    Quit date: 10/13/1958    Years since quitting: 63.7    Passive exposure: Past   Smokeless tobacco: Never  Vaping Use   Vaping Use: Never used  Substance and Sexual Activity   Alcohol use: Yes    Alcohol/week: 2.0 standard drinks of alcohol    Types: 1 Glasses of wine, 1 Standard drinks or equivalent per week    Comment: social   Drug use: Never   Sexual activity: Not Currently    Partners: Male    Birth control/protection: Surgical, Post-menopausal  Other Topics Concern   Not on file  Social History Narrative   ** Merged History Encounter **       Social Determinants of Health   Financial Resource Strain: Low Risk  (06/25/2022)   Overall Financial Resource Strain (CARDIA)    Difficulty of Paying Living Expenses: Not hard at all  Food Insecurity: No Food Insecurity (06/25/2022)   Hunger Vital Sign    Worried About Running Out of Food in the Last Year: Never true    Ran Out of Food in the Last Year: Never true  Transportation Needs: No Transportation Needs (06/25/2022)   PRAPARE - THydrologist(Medical): No    Lack of Transportation (Non-Medical): No  Physical Activity: Sufficiently Active (06/25/2022)   Exercise Vital Sign    Days of Exercise per Week: 7 days    Minutes of Exercise per  Session: 60 min  Stress: No Stress Concern Present (06/25/2022)   [place]   Feeling of Stress : Not at all  Social Connections: [place](06/25/2022)   Social Connection and Isolation Panel [NHANES]    Frequency of Communication with Friends and Family: More than three times a week    Frequency of Social Gatherings with Friends and Family: More than three times a week  Attends Religious Services: More than 4 times per year    Active Member of Clubs or Organizations: Yes    Attends Archivist Meetings: More than 4 times per year    Marital Status: Married    Tobacco Counseling Counseling given: Not Answered   Clinical Intake:  Pre-visit preparation completed: No  Pain : No/denies pain     BMI - recorded: 20.89 Nutritional Status: BMI of 19-24  Normal Nutritional Risks: None Diabetes: No  How often do you need to have someone help you when you read instructions, pamphlets, or other written materials from your doctor or pharmacy?: 1 - Never  Diabetic?  No  Interpreter Needed?: No  Information entered by :: Rojelio Uhrich LPN   Activities of Daily Living    06/25/2022    3:28 PM  In your present state of health, do you have any difficulty performing the following activities:  Hearing? 0  Vision? 0  Difficulty concentrating or making decisions? 0  Walking or climbing stairs? 0  Dressing or bathing? 0  Doing errands, shopping? 0  Preparing Food and eating ? N  Using the Toilet? N  In the past six months, have you accidently leaked urine? N  Do you have problems with loss of bowel control? N  Managing your Medications? N  Managing your Finances? N  Housekeeping or managing your Housekeeping? N    Patient Care Team: Eulas Post, MD as PCP - General (Family Medicine) Rolm Bookbinder, MD as Consulting Physician (Dermatology) Eulas Post, MD (Family  Medicine)  Indicate any recent Medical Services you may have received from other than [facility] providers in the past year (date may be approximate).     Assessment:   This is a routine wellness examination for [place].  Hearing/Vision screen Hearing Screening - Comments:: Denies hearing difficulties   Vision Screening - Comments:: - up to date with routine eye exams with Dr Chapman Fitch  Dietary issues and exercise activities discussed: Current Exercise Habits: Home exercise routine, Type of exercise: walking, Time (Minutes): 60, Frequency (Times/Week): 7, Weekly Exercise (Minutes/Week): 420, Intensity: Moderate, Exercise limited by: None identified   Goals Addressed               This Visit's Progress     Stay Healthy (pt-stated)        Win my tennis game.       Depression Screen    06/25/2022    3:26 PM 03/12/2022    4:23 PM 06/05/2021    3:07 PM 05/29/2020    3:22 PM 07/05/2018   11:23 AM  PHQ 2/9 Scores  PHQ - 2 Score 0 0 0 0 0  PHQ- 9 Score  3       Fall Risk    06/25/2022    3:29 PM 03/12/2022    4:23 PM 06/05/2021    3:10 PM 05/29/2020    3:25 PM 07/05/2018   11:23 AM  Fall Risk   Falls in the past year? 0 1 1 0 No  Number falls in past yr: 0 0 1 0   Injury with Fall? 0 1 0 0   Risk for fall due to : No Fall Risks No Fall Risks     Follow up Falls prevention discussed Falls evaluation completed Falls prevention discussed Falls prevention discussed     FALL RISK PREVENTION PERTAINING TO THE HOME:  Any stairs in or around the home? Yes  If so, are there any without  handrails? No  Home free of loose throw rugs in walkways, pet beds, electrical cords, etc? Yes  Adequate lighting in your home to reduce risk of falls? Yes   ASSISTIVE DEVICES UTILIZED TO PREVENT FALLS:  Life alert? No  Use of a cane, walker or w/c? No  Grab bars in the bathroom? Yes  Shower chair or bench in shower? Yes  Elevated toilet seat or a handicapped toilet? No   TIMED UP AND  GO:  Was the test performed? No . Audio Visit   Cognitive Function:        06/25/2022    3:30 PM 06/05/2021    3:12 PM 05/29/2020    3:36 PM  6CIT Screen  What Year? 0 points 0 points 0 points  What month? 0 points 0 points 0 points  What time? 0 points 0 points   Count back from 20 0 points 0 points 0 points  Months in reverse 0 points 0 points 0 points  Repeat phrase 0 points 0 points 10 points  Total Score 0 points 0 points     Immunizations Immunization History  Administered Date(s) Administered   PFIZER Comirnaty(Gray Top)Covid-19 Tri-Sucrose Vaccine 06/30/2020   PNEUMOCOCCAL CONJUGATE-20 03/12/2022   Tdap 06/07/2013, 06/23/2021    TDAP status: Up to date Reported by patient completed out of state  Flu Vaccine status: Up to date Reported by patient done out of state  Pneumococcal vaccine status: Up to date  Covid-19 vaccine status: Completed vaccines Reported by patient completed out of state   Qualifies for Shingles Vaccine? Yes   Zostavax completed Yes   Shingrix Completed?: Yes Patient reported done out of state  Screening Tests Health Maintenance  Topic Date Due   Zoster Vaccines- Shingrix (1 of 2) Never done   COVID-19 Vaccine (2 - Pfizer series) 08/25/2020   INFLUENZA VACCINE  05/13/2022   TETANUS/TDAP  06/24/2031   Pneumonia Vaccine 71+ Years old  Completed   DEXA SCAN  Completed   HPV VACCINES  Aged Out    Health Maintenance  Health Maintenance Due  Topic Date Due   Zoster Vaccines- Shingrix (1 of 2) Never done   COVID-19 Vaccine (2 - Pfizer series) 08/25/2020   INFLUENZA VACCINE  05/13/2022    Colorectal cancer screening: No longer required.   Mammogram status: No longer required due to Age.  Bone Density status: Completed 07/02/16. Results reflect: Bone density results: OSTEOPOROSIS. Repeat every   years.  Lung Cancer Screening: (Low Dose CT Chest recommended if Age 60-80 years, 30 pack-year currently smoking OR have quit w/in 15years.)  does not qualify.    Additional Screening:  Hepatitis C Screening: does not qualify; Completed   Vision Screening: Recommended annual ophthalmology exams for early detection of glaucoma and other disorders of the eye. Is the patient up to date with their annual eye exam?  Yes  Who is the provider or what is the name of the office in which the patient attends annual eye exams? Dr Laqueta Due If pt is not established with a provider, would they like to be referred to a provider to establish care? No .   Dental Screening: Recommended annual dental exams for proper oral hygiene  Community Resource Referral / Chronic Care Management: CRR required this visit?  No   CCM required this visit?  No      Plan:     I have personally reviewed and noted the following in the patient's chart:   Medical and social history Use  of alcohol, tobacco or illicit drugs  Current medications and supplements including opioid prescriptions. Patient is not currently taking opioid prescriptions. Functional ability and status Nutritional status Physical activity Advanced directives List of other physicians Hospitalizations, surgeries, and ER visits in previous 12 months Vitals Screenings to include cognitive, depression, and falls Referrals and appointments  In addition, I have reviewed and discussed with patient certain preventive protocols, quality metrics, and best practice recommendations. A written personalized care plan for preventive services as well as general preventive health recommendations were provided to patient.     Veronica Peaches, LPN   10/15/1592   Nurse Notes: Patient reported Vaccines completed out of state will update.

## 2022-06-25 NOTE — Patient Instructions (Addendum)
Ms. Pricilla Loveless Charna Archer , Thank you for taking time to come for your Medicare Wellness Visit. I appreciate your ongoing commitment to your health goals. Please review the following plan we discussed and let me know if I can assist you in the future.   Screening recommendations/referrals: Colonoscopy: No longer required Mammogram: No longer required Bone Density: Done Recommended yearly ophthalmology/optometry visit for glaucoma screening and checkup Recommended yearly dental visit for hygiene and checkup  Vaccinations: Influenza vaccine: Reported by patent done out of state Pneumococcal vaccine: Up to date Tdap vaccine: Updated Shingles vaccine: Patient reported completed   Covid-19:Patient reported completed  Advanced directives: Please bring a copy of your health care power of attorney and living will to the office to be added to your chart at your convenience.   Conditions/risks identified: None  Next appointment: Follow up in one year for your annual wellness visit   Preventive Care 84 Years and Older, Female Preventive care refers to lifestyle choices and visits with your health care provider that can promote health and wellness. What does preventive care include? A yearly physical exam. This is also called an annual well check. Dental exams once or twice a year. Routine eye exams. Ask your health care provider how often you should have your eyes checked. Personal lifestyle choices, including: Daily care of your teeth and gums. Regular physical activity. Eating a healthy diet. Avoiding tobacco and drug use. Limiting alcohol use. Practicing safe sex. Taking low-dose aspirin every day. Taking vitamin and mineral supplements as recommended by your health care provider. What happens during an annual well check? The services and screenings done by your health care provider during your annual well check will depend on your age, overall health, lifestyle risk factors, and family  history of disease. Counseling  Your health care provider may ask you questions about your: Alcohol use. Tobacco use. Drug use. Emotional well-being. Home and relationship well-being. Sexual activity. Eating habits. History of falls. Memory and ability to understand (cognition). Work and work Statistician. Reproductive health. Screening  You may have the following tests or measurements: Height, weight, and BMI. Blood pressure. Lipid and cholesterol levels. These may be checked every 5 years, or more frequently if you are over 50 years old. Skin check. Lung cancer screening. You may have this screening every year starting at age 84 if you have a 30-pack-year history of smoking and currently smoke or have quit within the past 15 years. Fecal occult blood test (FOBT) of the stool. You may have this test every year starting at age 84 Flexible sigmoidoscopy or colonoscopy. You may have a sigmoidoscopy every 5 years or a colonoscopy every 10 years starting at age 30. Hepatitis C blood test. Hepatitis B blood test. Sexually transmitted disease (STD) testing. Diabetes screening. This is done by checking your blood sugar (glucose) after you have not eaten for a while (fasting). You may have this done every 1-3 years. Bone density scan. This is done to screen for osteoporosis. You may have this done starting at age 84. Mammogram. This may be done every 1-2 years. Talk to your health care provider about how often you should have regular mammograms. Talk with your health care provider about your test results, treatment options, and if necessary, the need for more tests. Vaccines  Your health care provider may recommend certain vaccines, such as: Influenza vaccine. This is recommended every year. Tetanus, diphtheria, and acellular pertussis (Tdap, Td) vaccine. You may need a Td booster every 10 years. Zoster  vaccine. You may need this after age 61. Pneumococcal 13-valent conjugate (PCV13)  vaccine. One dose is recommended after age 84. Pneumococcal polysaccharide (PPSV23) vaccine. One dose is recommended after age 84. Talk to your health care provider about which screenings and vaccines you need and how often you need them. This information is not intended to replace advice given to you by your health care provider. Make sure you discuss any questions you have with your health care provider. Document Released: 10/26/2015 Document Revised: 06/18/2016 Document Reviewed: 07/31/2015 Elsevier Interactive Patient Education  2017 [employer] Prevention in the Home Falls can cause injuries. They can happen to people of all ages. There are many things you can do to make your home safe and to help prevent falls. What can I do on the outside of my home? Regularly fix the edges of walkways and driveways and fix any cracks. Remove anything that might make you trip as you walk through a door, such as a raised step or threshold. Trim any bushes or trees on the path to your home. Use bright outdoor lighting. Clear any walking paths of anything that might make someone trip, such as rocks or tools. Regularly check to see if handrails are loose or broken. Make sure that both sides of any steps have handrails. Any raised decks and porches should have guardrails on the edges. Have any leaves, snow, or ice cleared regularly. Use sand or salt on walking paths during winter. Clean up any spills in your garage right away. This includes oil or grease spills. What can I do in the bathroom? Use night lights. Install grab bars by the toilet and in the tub and shower. Do not use towel bars as grab bars. Use non-skid mats or decals in the tub or shower. If you need to sit down in the shower, use a plastic, non-slip stool. Keep the floor dry. Clean up any water that spills on the floor as soon as it happens. Remove soap buildup in the tub or shower regularly. Attach bath mats securely with  double-sided non-slip rug tape. Do not have throw rugs and other things on the floor that can make you trip. What can I do in the bedroom? Use night lights. Make sure that you have a light by your bed that is easy to reach. Do not use any sheets or blankets that are too big for your bed. They should not hang down onto the floor. Have a firm chair that has side arms. You can use this for support while you get dressed. Do not have throw rugs and other things on the floor that can make you trip. What can I do in the kitchen? Clean up any spills right away. Avoid walking on wet floors. Keep items that you use a lot in easy-to-reach places. If you need to reach something above you, use a strong step stool that has a grab bar. Keep electrical cords out of the way. Do not use floor polish or wax that makes floors slippery. If you must use wax, use non-skid floor wax. Do not have throw rugs and other things on the floor that can make you trip. What can I do with my stairs? Do not leave any items on the stairs. Make sure that there are handrails on both sides of the stairs and use them. Fix handrails that are broken or loose. Make sure that handrails are as long as the stairways. Check any carpeting to make sure that it  is firmly attached to the stairs. Fix any carpet that is loose or worn. Avoid having throw rugs at the top or bottom of the stairs. If you do have throw rugs, attach them to the floor with carpet tape. Make sure that you have a light switch at the top of the stairs and the bottom of the stairs. If you do not have them, ask someone to add them for you. What else can I do to help prevent falls? Wear shoes that: Do not have high heels. Have rubber bottoms. Are comfortable and fit you well. Are closed at the toe. Do not wear sandals. If you use a stepladder: Make sure that it is fully opened. Do not climb a closed stepladder. Make sure that both sides of the stepladder are locked  into place. Ask someone to hold it for you, if possible. Clearly mark and make sure that you can see: Any grab bars or handrails. First and last steps. Where the edge of each step is. Use tools that help you move around (mobility aids) if they are needed. These include: Canes. Walkers. Scooters. Crutches. Turn on the lights when you go into a dark area. Replace any light bulbs as soon as they burn out. Set up your furniture so you have a clear path. Avoid moving your furniture around. If any of your floors are uneven, fix them. If there are any pets around you, be aware of where they are. Review your medicines with your doctor. Some medicines can make you feel dizzy. This can increase your chance of falling. Ask your doctor what other things that you can do to help prevent falls. This information is not intended to replace advice given to you by your health care provider. Make sure you discuss any questions you have with your health care provider. Document Released: 07/26/2009 Document Revised: 03/06/2016 Document Reviewed: 11/03/2014 Elsevier Interactive Patient Education  2017 [employer].

## 2022-09-01 DIAGNOSIS — L821 Other seborrheic keratosis: Secondary | ICD-10-CM | POA: Diagnosis not present

## 2022-09-01 DIAGNOSIS — L905 Scar conditions and fibrosis of skin: Secondary | ICD-10-CM | POA: Diagnosis not present

## 2022-09-01 DIAGNOSIS — L570 Actinic keratosis: Secondary | ICD-10-CM | POA: Diagnosis not present

## 2022-09-01 DIAGNOSIS — L578 Other skin changes due to chronic exposure to nonionizing radiation: Secondary | ICD-10-CM | POA: Diagnosis not present

## 2022-09-01 DIAGNOSIS — D225 Melanocytic nevi of trunk: Secondary | ICD-10-CM | POA: Diagnosis not present

## 2022-09-01 DIAGNOSIS — L814 Other melanin hyperpigmentation: Secondary | ICD-10-CM | POA: Diagnosis not present

## 2022-09-01 DIAGNOSIS — S80212A Abrasion, left knee, initial encounter: Secondary | ICD-10-CM | POA: Diagnosis not present

## 2022-09-01 DIAGNOSIS — S0081XA Abrasion of other part of head, initial encounter: Secondary | ICD-10-CM | POA: Diagnosis not present

## 2022-09-01 DIAGNOSIS — S80211A Abrasion, right knee, initial encounter: Secondary | ICD-10-CM | POA: Diagnosis not present

## 2023-06-30 ENCOUNTER — Encounter (INDEPENDENT_AMBULATORY_CARE_PROVIDER_SITE_OTHER): Payer: Medicare PPO | Admitting: Family Medicine

## 2023-06-30 NOTE — Progress Notes (Signed)
error 

## 2023-10-05 ENCOUNTER — Other Ambulatory Visit: Payer: Self-pay

## 2023-10-05 ENCOUNTER — Emergency Department (HOSPITAL_COMMUNITY)
Admission: EM | Admit: 2023-10-05 | Discharge: 2023-10-05 | Disposition: A | Discharge status: Home / Self Care | Payer: Medicare PPO | Attending: Emergency Medicine | Admitting: Emergency Medicine

## 2023-10-05 ENCOUNTER — Emergency Department (HOSPITAL_COMMUNITY): Payer: Medicare PPO

## 2023-10-05 ENCOUNTER — Encounter (HOSPITAL_COMMUNITY): Payer: Self-pay

## 2023-10-05 DIAGNOSIS — S0011XA Contusion of right eyelid and periocular area, initial encounter: Secondary | ICD-10-CM | POA: Diagnosis not present

## 2023-10-05 DIAGNOSIS — Y92480 Sidewalk as the place of occurrence of the external cause: Secondary | ICD-10-CM | POA: Diagnosis not present

## 2023-10-05 DIAGNOSIS — R2232 Localized swelling, mass and lump, left upper limb: Secondary | ICD-10-CM | POA: Insufficient documentation

## 2023-10-05 DIAGNOSIS — R0789 Other chest pain: Secondary | ICD-10-CM | POA: Insufficient documentation

## 2023-10-05 DIAGNOSIS — W000XXA Fall on same level due to ice and snow, initial encounter: Secondary | ICD-10-CM | POA: Insufficient documentation

## 2023-10-05 DIAGNOSIS — S0012XA Contusion of left eyelid and periocular area, initial encounter: Secondary | ICD-10-CM | POA: Insufficient documentation

## 2023-10-05 DIAGNOSIS — S00531A Contusion of lip, initial encounter: Secondary | ICD-10-CM | POA: Diagnosis not present

## 2023-10-05 DIAGNOSIS — Y9301 Activity, walking, marching and hiking: Secondary | ICD-10-CM | POA: Diagnosis not present

## 2023-10-05 DIAGNOSIS — S022XXA Fracture of nasal bones, initial encounter for closed fracture: Secondary | ICD-10-CM | POA: Insufficient documentation

## 2023-10-05 DIAGNOSIS — R040 Epistaxis: Secondary | ICD-10-CM | POA: Diagnosis present

## 2023-10-05 MED ORDER — ACETAMINOPHEN 500 MG PO TABS
1000.0000 mg | ORAL_TABLET | Freq: Once | ORAL | Status: AC
  Administered 2023-10-05: 1000 mg via ORAL

## 2023-10-05 MED ORDER — ACETAMINOPHEN 500 MG PO TABS
ORAL_TABLET | ORAL | Status: AC
  Filled 2023-10-05: qty 2

## 2023-10-05 MED ORDER — AMOXICILLIN-POT CLAVULANATE 875-125 MG PO TABS: 1.0000 | ORAL_TABLET | Freq: Two times a day (BID) | ORAL | 0 refills | Status: DC

## 2023-10-05 MED ORDER — AMOXICILLIN-POT CLAVULANATE 875-125 MG PO TABS
1.0000 | ORAL_TABLET | Freq: Once | ORAL | Status: AC
  Administered 2023-10-05: 1 via ORAL
  Filled 2023-10-05: qty 1

## 2023-10-05 MED ORDER — OXYMETAZOLINE HCL 0.05 % NA SOLN
1.0000 | Freq: Once | NASAL | Status: AC
  Administered 2023-10-05: 1 via NASAL
  Filled 2023-10-05: qty 30

## 2023-10-05 NOTE — ED Triage Notes (Signed)
Pt is coming in after a fall from standing while walking to this sidewalk this morning at 0915, she went to urgent care for complaints of nose pain (which is bleeding),  right rib pain, left index finger pain. Pt is no on blood thinners and is stable at this time.

## 2023-10-05 NOTE — Discharge Instructions (Addendum)
As we discussed, you have nasal bone fractures.  Your face is going to be more swollen for the next 1 to 2 days.  I recommend ice pack and Tylenol as needed  I have prescribed Augmentin twice daily for a week to prevent sinus infection  I have referred you to Dr. Anabel Halon office for follow-up.  If you have nosebleed I recommend putting afrin to your nose and hold pressure. If you have persistent bleeding, please return to the ED

## 2023-10-05 NOTE — ED Notes (Addendum)
Pt has ecchymotic area above upper lip and below right eye.  Pt has dried blood in nostrils. Pt's nostrils a leaking a small amount of blood.

## 2023-10-05 NOTE — ED Provider Notes (Signed)
[place] EMERGENCY DEPARTMENT AT [facility] Provider Note   CSN: [id number] Arrival date & time: 10/05/23  1520     History  Chief Complaint  Patient presents with   Veronica Wells is a 87 y.o. female here presenting with fall.  Patient states that it was cold outside and she may have slipped on ice and fell face forward this morning.  She states that she hit her nose.  She also hit her left index finger.  She went to another ER and apparently had nasal packing that was placed and then removed.  She also had an appointment with ENT but she had some nosebleeds so was told to come here instead.  Patient is not on any blood thinners.  The history is provided by the patient.       Home Medications Prior to Admission medications   Medication Sig Start Date End Date Taking? Authorizing Provider  Calcium Carbonate-Vitamin D (CALCIUM + D PO) Take 1 tablet by mouth daily.     [provider]  calcium-vitamin D (OSCAL WITH D) 250-125 MG-UNIT tablet Take 1 tablet by mouth daily.    [provider]  Omega-3 Fatty Acids (OMEGA 3 PO) Take 1 capsule by mouth daily.    [provider]  Red Yeast Rice Extract (RED YEAST RICE PO) Take by mouth.    [provider]      Allergies    Patient has no known allergies.    Review of Systems   Review of Systems  HENT:  Positive for nosebleeds.   All other systems reviewed and are negative.   Physical Exam Updated Vital Signs BP (!) 171/92   Pulse 79   Temp 97.9 F (36.6 C)   Resp 18   Ht 4\' 9"  (1.448 m)   Wt 44 kg   SpO2 98%   BMI 20.99 kg/m  Physical Exam Vitals and nursing note reviewed.  Constitutional:      Appearance: Normal appearance.  HENT:     Nose:     Comments: Patient has dried blood in the left nostril.  There is no septal hematoma.     Mouth/Throat:     Comments: Patient has some bruising of the upper lip.  Eyes:     Comments: Patient does have  bruising under bilateral eyes.  Extraocular movements are intact  Cardiovascular:     Rate and Rhythm: Normal rate and regular rhythm.     Pulses: Normal pulses.     Heart sounds: Normal heart sounds.  Pulmonary:     Effort: Pulmonary effort is normal.     Breath sounds: Normal breath sounds.     Comments: Tenderness of the right chest wall but no obvious ecchymosis Abdominal:     General: Abdomen is flat.     Palpations: Abdomen is soft.  Musculoskeletal:        General: Normal range of motion.     Cervical back: Normal range of motion and neck supple.     Comments: Left index finger slightly swollen but no obvious deformity  Neurological:     General: No focal deficit present.     Mental Status: She is alert and oriented to person, place, and time.  Psychiatric:        Mood and Affect: Mood normal.        Behavior: Behavior normal.     ED Results / Procedures / Treatments   Labs (  all labs ordered are listed, but only abnormal results are displayed) Labs Reviewed - No data to display  EKG None  Radiology CT Maxillofacial Wo Contrast Result Date: 10/05/2023 CLINICAL DATA:  Fall and trauma to the face. EXAM: CT MAXILLOFACIAL WITHOUT CONTRAST TECHNIQUE: Multidetector CT imaging of the maxillofacial structures was performed. Multiplanar CT image reconstructions were also generated. RADIATION DOSE REDUCTION: This exam was performed according to the departmental dose-optimization program which includes automated exposure control, adjustment of the mA and/or kV according to patient size and/or use of iterative reconstruction technique. COMPARISON:  Head CT dated 06/23/2021. FINDINGS: Osseous: Mildly displaced fracture of the right nasal bone with mild deviation of the nose to the left. A nondisplaced fracture of the left nasal bone may be present. Minimally displaced fracture of the nasal spine. No mandibular subluxation. Orbits: The globes and retro-orbital fat are preserved. Sinuses:  There is mild mucoperiosteal thickening of paranasal sinuses. There is a 2 cm retention cyst or polyp in the right maxillary sinus. No air-fluid level. The mastoid air cells are clear. Soft tissues: Mild soft tissue swelling of the nose. Limited intracranial: No significant or unexpected finding. IMPRESSION: 1. Mildly displaced fracture of the right nasal bone with mild deviation of the nose to the left. 2. Minimally displaced fracture of the nasal spine. Electronically Signed   By: Elgie Collard M.D.   On: 10/05/2023 18:23   DG Finger Index Left Result Date: 10/05/2023 CLINICAL DATA:  Pain after a fall. EXAM: LEFT INDEX FINGER 2+V COMPARISON:  None Available. FINDINGS: Degenerative changes in the interphalangeal joints of the left second finger. Soft tissue swelling. Old appearing ununited ossicle at the dorsal plate of the middle phalanx. No acute displaced fractures are identified. Calcification in the soft tissues is likely dystrophic. IMPRESSION: Prominent degenerative changes in the interphalangeal joints. No acute displaced fractures identified. Electronically Signed   By: Burman Nieves M.D.   On: 10/05/2023 18:06   DG Chest 2 View Result Date: 10/05/2023 CLINICAL DATA:  SOB chronic cough EXAM: CHEST - 2 VIEW COMPARISON:  06/23/2021. FINDINGS: The heart size and mediastinal contours are within normal limits. Lungs are hyperinflated suggesting COPD. There is no focal consolidation. No pneumothorax or pleural effusion. Aorta is calcified and tortuous. There are thoracic degenerative changes. IMPRESSION: Findings suggest COPD.  Otherwise no acute cardiopulmonary disease. Electronically Signed   By: Layla Maw M.D.   On: 10/05/2023 17:22    Procedures Procedures    Medications Ordered in ED Medications  oxymetazoline (AFRIN) 0.05 % nasal spray 1 spray (has no administration in time range)  amoxicillin-clavulanate (AUGMENTIN) 875-125 MG per tablet 1 tablet (has no administration in  time range)  acetaminophen (TYLENOL) tablet 1,000 mg (1,000 mg Oral Given 10/05/23 1605)    ED Course/ Medical Decision Making/ A&P                                 Medical Decision Making Veronica Wells is a 86 y.o. female here presenting with facial trauma with nosebleed.  CT showed displaced right nasal bone fracture.  Patient has minimal epistaxis that was well-controlled with Afrin.  I reviewed her chest x-ray and left index finger x-ray and there were no fractures.  Pain is under control.  Will discharge home with a course of Augmentin and will have her follow-up with ENT   Problems Addressed: Closed fracture of nasal bone, initial encounter: acute illness  or injury  Amount and/or Complexity of Data Reviewed Radiology: ordered and independent interpretation performed. Decision-making details documented in ED Course.  Risk OTC drugs. Prescription drug management.    Final Clinical Impression(s) / ED Diagnoses Final diagnoses:  None    Rx / DC Orders ED Discharge Orders     None         Charlynne Pander, MD 10/05/23 [id number]

## 2024-03-17 DIAGNOSIS — L578 Other skin changes due to chronic exposure to nonionizing radiation: Secondary | ICD-10-CM | POA: Diagnosis not present

## 2024-03-17 DIAGNOSIS — L821 Other seborrheic keratosis: Secondary | ICD-10-CM | POA: Diagnosis not present

## 2024-03-17 DIAGNOSIS — L570 Actinic keratosis: Secondary | ICD-10-CM | POA: Diagnosis not present

## 2024-03-17 DIAGNOSIS — Z08 Encounter for follow-up examination after completed treatment for malignant neoplasm: Secondary | ICD-10-CM | POA: Diagnosis not present

## 2024-03-17 DIAGNOSIS — D485 Neoplasm of uncertain behavior of skin: Secondary | ICD-10-CM | POA: Diagnosis not present

## 2024-03-17 DIAGNOSIS — D225 Melanocytic nevi of trunk: Secondary | ICD-10-CM | POA: Diagnosis not present

## 2024-03-17 DIAGNOSIS — L814 Other melanin hyperpigmentation: Secondary | ICD-10-CM | POA: Diagnosis not present

## 2024-03-17 DIAGNOSIS — Z85820 Personal history of malignant melanoma of skin: Secondary | ICD-10-CM | POA: Diagnosis not present

## 2024-03-17 DIAGNOSIS — Z85828 Personal history of other malignant neoplasm of skin: Secondary | ICD-10-CM | POA: Diagnosis not present

## 2024-04-26 ENCOUNTER — Telehealth: Payer: Self-pay

## 2024-04-26 NOTE — Telephone Encounter (Signed)
 Copied from CRM [id number]. Topic: Referral - Request for Referral >> Apr 26, 2024 10:29 AM Mercedes MATSU wrote: Did the patient discuss referral with their provider in the last year? No (If No - schedule appointment) (If Yes - send message)  Appointment offered? No  Type of order/referral and detailed reason for visit: ENT referral  Preference of office, provider, location: Patient requesting a recommendation  If referral order, have you been seen by this specialty before? Yes (If Yes, this issue or another issue? When? Where?  Can we respond through MyChart? No, patient requesting a call back once the referral has been sent [phone].

## 2024-04-26 NOTE — Telephone Encounter (Signed)
 F/U has been scheduled to discuss

## 2024-05-03 ENCOUNTER — Encounter: Payer: Self-pay | Admitting: Family Medicine

## 2024-05-03 ENCOUNTER — Ambulatory Visit (INDEPENDENT_AMBULATORY_CARE_PROVIDER_SITE_OTHER): Admitting: Family Medicine

## 2024-05-03 VITALS — BP 172/90 | HR 67 | Temp 98.0°F | Wt 98.6 lb

## 2024-05-03 DIAGNOSIS — H6123 Impacted cerumen, bilateral: Secondary | ICD-10-CM | POA: Diagnosis not present

## 2024-05-03 DIAGNOSIS — I10 Essential (primary) hypertension: Secondary | ICD-10-CM | POA: Diagnosis not present

## 2024-05-03 MED ORDER — AMLODIPINE BESYLATE 5 MG PO TABS: 5.0000 mg | ORAL_TABLET | Freq: Every day | ORAL | 2 refills | Status: DC

## 2024-05-03 NOTE — Progress Notes (Signed)
 Established Patient Office Visit  Subjective   Patient ID: Veronica Wells, female    DOB: [birth year]  Age: 88 y.o. MRN: [id number]  Chief Complaint  Patient presents with   Hearing Problem    HPI   Veronica Wells is seen today for the following concerns  Bilateral hearing loss left ear greater than right.  She has been told by her children that she needs to get her hearing checked.  She has considered audiology referral but wanted to make sure she did not have a blockage from wax first.  She denies any ear pain or drainage.  No vertigo.  Elevated blood pressure.  Never diagnosed or treated for hypertension.  In looking back she had ER visit in December and had blood pressure 171/92 then.  Blood pressure is quite elevated today.  No headaches.  No chest pains.  No peripheral edema.  No regular alcohol use.  She walks about 3 miles per day.  Past Medical History:  Diagnosis Date   Arthritis    Colon polyps    Difficulty sleeping    Environmental allergies    Headaches, cluster    Hemorrhoids    HLD (hyperlipidemia)    Melanoma (HCC) 10/13/2001   melanoma on back right arm   Osteopenia    Past Surgical History:  Procedure Laterality Date   BILATERAL ANTERIOR TOTAL HIP ARTHROPLASTY Bilateral    KNEE SURGERY Right 10/13/2009   Replacement/right knee   MELANOMA EXCISION  10/13/2001   ORIF PELVIC FRACTURE Right 06/25/2021   Procedure: OPEN REDUCTION INTERNAL FIXATION (ORIF) PELVIC FRACTURE;  Surgeon: Celena Sharper, MD;  Location: [facility] OR;  Service: Orthopedics;  Laterality: Right;   REPLACEMENT TOTAL KNEE Right    2010   TOTAL HIP ARTHROPLASTY Right 06/19/2015   Procedure: RIGHT TOTAL HIP ARTHROPLASTY ANTERIOR APPROACH;  Surgeon: Donnice Car, MD;  Location: [facility];  Service: Orthopedics;  Laterality: Right;   TOTAL HIP ARTHROPLASTY Left 07/13/2018   Procedure: LEFT TOTAL HIP ARTHROPLASTY ANTERIOR APPROACH;  Surgeon: Car Donnice, MD;  Location: [facility];   Service: Orthopedics;  Laterality: Left;    TUBAL LIGATION      reports that she quit smoking about 65 years ago. Her smoking use included cigarettes. She has been exposed to tobacco smoke. She has never used smokeless tobacco. She reports current alcohol use of about 2.0 standard drinks of alcohol per week. She reports that she does not use drugs. family history includes Asthma in her son; Breast cancer in her cousin, paternal aunt, and paternal aunt; Cancer - Colon (age of onset: 70) in her mother; Colon polyps in her son; Heart disease in her father; Heart failure in her father; Osteoporosis in her maternal aunt and mother. No Known Allergies  Review of Systems  Constitutional:  Negative for malaise/fatigue.  HENT:  Positive for hearing loss. Negative for ear discharge, ear pain and tinnitus.   Eyes:  Negative for blurred vision.  Respiratory:  Negative for shortness of breath.   Cardiovascular:  Negative for chest pain.  Neurological:  Negative for dizziness, weakness and headaches.      Objective:     BP (!) 172/90 (BP Location: Left Arm, Cuff Size: Normal)   Pulse 67   Temp 98 F (36.7 C) (Oral)   Wt 98 lb 9.6 oz (44.7 kg)   SpO2 96%   BMI 21.34 kg/m  BP Readings from Last 3 Encounters:  05/03/24 (!) 172/90  10/05/23 (!) 171/92  03/12/22 138/76   Wt Readings from Last 3 Encounters:  05/03/24 98 lb 9.6 oz (44.7 kg)  10/05/23 97 lb (44 kg)  06/25/22 97 lb (44 kg)      Physical Exam Vitals reviewed.  Constitutional:      Appearance: She is well-developed.  HENT:     Ears:     Comments: Cerumen impaction bilaterally Eyes:     Pupils: Pupils are equal, round, and reactive to light.  Neck:     Thyroid: No thyromegaly.     Vascular: No JVD.  Cardiovascular:     Rate and Rhythm: Normal rate and regular rhythm.     Heart sounds:     No gallop.  Pulmonary:     Effort: Pulmonary effort is normal. No respiratory distress.     Breath sounds: Normal breath  sounds. No wheezing or rales.  Musculoskeletal:     Cervical back: Neck supple.     Right lower leg: No edema.     Left lower leg: No edema.  Neurological:     Mental Status: She is alert.      No results found for any visits on 05/03/24.    The ASCVD Risk score (Arnett DK, et al., 2019) failed to calculate for the following reasons:   The 2019 ASCVD risk score is only valid for ages 70 to 106    Assessment & Plan:   #1 severe hypertension.  Repeat blood pressure left arm seated after rest 172/98.  This is very similar to readings she had in the ER back in December.  We recommended Dash eating plan with handout given.  Start amlodipine  5 mg once daily.  Set up 4-week follow-up.  Continue regular walking for exercise.  Try to keep daily sodium less than 2500 mg  #2 bilateral hearing loss.  Cerumen impactions bilaterally Discussed risk of irrigation including risk of pain, bleeding, low risk of eardrum perforation.  Patient consented.  Nurse was able to irrigate out some cerumen.  Then used a curette and I was able to remove out a large white mass which turned out to be piece of cotton from her right canal.  Right canal clear of cerumen.  Left canal is impacted with cerumen and we were able to remove most of the cerumen but there are some residual cerumen deep in the canal.  She will try some over-the-counter Debrox or Cerumenex followed by gentle irrigation with rubber bulb syringe and lukewarm water.  Reassess at follow-up here in 1 month Patient did note dramatic improvement in hearing after irrigation  Wolm Scarlet, MD

## 2024-05-03 NOTE — Patient Instructions (Addendum)
 Be sure to set up 4 week follow up to reassess blood pressure.   Try over the counter Debrox or Cerumenex ear drops and after 30-60 minutes irrigate out with warm water and rubber bulb syringe.

## 2024-05-31 ENCOUNTER — Ambulatory Visit: Payer: Self-pay | Admitting: Family Medicine

## 2024-05-31 ENCOUNTER — Encounter: Payer: Self-pay | Admitting: Family Medicine

## 2024-05-31 ENCOUNTER — Ambulatory Visit (INDEPENDENT_AMBULATORY_CARE_PROVIDER_SITE_OTHER): Admitting: Family Medicine

## 2024-05-31 VITALS — BP 128/62 | HR 59 | Temp 98.1°F | Wt 96.5 lb

## 2024-05-31 DIAGNOSIS — I10 Essential (primary) hypertension: Secondary | ICD-10-CM

## 2024-05-31 LAB — BASIC METABOLIC PANEL WITH GFR
BUN: 46 mg/dL — ABNORMAL HIGH (ref 6–23)
CO2: 26 meq/L (ref 19–32)
Calcium: 9.9 mg/dL (ref 8.4–10.5)
Chloride: 105 meq/L (ref 96–112)
Creatinine, Ser: 1.2 mg/dL (ref 0.40–1.20)
GFR: 41.2 mL/min — ABNORMAL LOW (ref >60.00)
Glucose, Bld: 95 mg/dL (ref 70–99)
Potassium: 4.5 meq/L (ref 3.5–5.1)
Sodium: 138 meq/L (ref 135–145)

## 2024-05-31 NOTE — Progress Notes (Signed)
 Established Patient Office Visit  Subjective   Patient ID: Veronica Wells, female    DOB: 1936-01-16  Age: 88 y.o. MRN: [id number]  Chief Complaint  Patient presents with   Medical Management of Chronic Issues    HPI   Veronica Wells is seen today for follow-up hypertension.  Last visit we initiated amlodipine  5 mg daily.  She had blood pressure 172/90 and had at least a few readings in this range prior to initiating therapy.  She denied any headaches or chest pains.  She is tolerating medication well.  Blood pressure greatly improved today.  She denies any dizziness.  She continues to play tennis regularly.  No increased peripheral edema.  In addition to tennis walks about 3 miles per day.  Past Medical History:  Diagnosis Date   Arthritis    Colon polyps    Difficulty sleeping    Environmental allergies    Headaches, cluster    Hemorrhoids    HLD (hyperlipidemia)    Melanoma (HCC) 10/13/2001   melanoma on back right arm   Osteopenia    Past Surgical History:  Procedure Laterality Date   BILATERAL ANTERIOR TOTAL HIP ARTHROPLASTY Bilateral    KNEE SURGERY Right 10/13/2009   Replacement/right knee   MELANOMA EXCISION  10/13/2001   ORIF PELVIC FRACTURE Right 06/25/2021   Procedure: OPEN REDUCTION INTERNAL FIXATION (ORIF) PELVIC FRACTURE;  Surgeon: Celena Sharper, MD;  Location: [facility] OR;  Service: Orthopedics;  Laterality: Right;   REPLACEMENT TOTAL KNEE Right    2010   TOTAL HIP ARTHROPLASTY Right 06/19/2015   Procedure: RIGHT TOTAL HIP ARTHROPLASTY ANTERIOR APPROACH;  Surgeon: Donnice Car, MD;  Location: [facility];  Service: Orthopedics;  Laterality: Right;   TOTAL HIP ARTHROPLASTY Left 07/13/2018   Procedure: LEFT TOTAL HIP ARTHROPLASTY ANTERIOR APPROACH;  Surgeon: Car Donnice, MD;  Location: [facility];  Service: Orthopedics;  Laterality: Left;    TUBAL LIGATION      reports that she quit smoking about 65 years ago. Her smoking use included cigarettes.  She has been exposed to tobacco smoke. She has never used smokeless tobacco. She reports current alcohol use of about 2.0 standard drinks of alcohol per week. She reports that she does not use drugs. family history includes Asthma in her son; Breast cancer in her cousin, paternal aunt, and paternal aunt; Cancer - Colon (age of onset: 70) in her mother; Colon polyps in her son; Heart disease in her father; Heart failure in her father; Osteoporosis in her maternal aunt and mother. No Known Allergies  Review of Systems  Constitutional:  Negative for malaise/fatigue.  Eyes:  Negative for blurred vision.  Respiratory:  Negative for shortness of breath.   Cardiovascular:  Negative for chest pain.  Neurological:  Negative for dizziness, weakness and headaches.      Objective:     BP 100/60   Pulse (!) 59   Temp 98.1 F (36.7 C) (Oral)   Wt 96 lb 8 oz (43.8 kg)   SpO2 94%   BMI 20.88 kg/m  BP Readings from Last 3 Encounters:  05/31/24 100/60  05/03/24 (!) 172/90  10/05/23 (!) 171/92   Wt Readings from Last 3 Encounters:  05/31/24 96 lb 8 oz (43.8 kg)  05/03/24 98 lb 9.6 oz (44.7 kg)  10/05/23 97 lb (44 kg)      Physical Exam Vitals reviewed.  Constitutional:      Appearance: She is well-developed.  Eyes:     Pupils:  Pupils are equal, round, and reactive to light.  Neck:     Thyroid: No thyromegaly.     Vascular: No JVD.  Cardiovascular:     Rate and Rhythm: Normal rate and regular rhythm.     Heart sounds:     No gallop.  Pulmonary:     Effort: Pulmonary effort is normal. No respiratory distress.     Breath sounds: Normal breath sounds. No wheezing or rales.  Musculoskeletal:     Cervical back: Neck supple.     Right lower leg: No edema.     Left lower leg: No edema.  Neurological:     Mental Status: She is alert.      No results found for any visits on 05/31/24.    The ASCVD Risk score (Arnett DK, et al., 2019) failed to calculate for the following  reasons:   The 2019 ASCVD risk score is only valid for ages 32 to 43    Assessment & Plan:   Problem List Items Addressed This Visit       Unprioritized   Essential hypertension - Primary   Relevant Orders   Basic metabolic panel with GFR  Essential hypertension improved with recent addition of amlodipine  5 mg daily.  Tolerating medication well with no side effects.  Continue low-sodium diet.  Continue regular exercise habits.  Continue current dose of amlodipine . Check basic metabolic panel.  She does have history of chronic kidney disease stage III based on prior labs.  Continue to avoid nonsteroidals  Return in about 6 months (around 12/01/2024).    Wolm Scarlet, MD

## 2024-07-21 ENCOUNTER — Other Ambulatory Visit: Payer: Self-pay | Admitting: Family Medicine

## 2024-07-21 DIAGNOSIS — Z87891 Personal history of nicotine dependence: Secondary | ICD-10-CM | POA: Diagnosis not present

## 2024-07-21 DIAGNOSIS — Z8249 Family history of ischemic heart disease and other diseases of the circulatory system: Secondary | ICD-10-CM | POA: Diagnosis not present

## 2024-07-21 DIAGNOSIS — Z9181 History of falling: Secondary | ICD-10-CM | POA: Diagnosis not present

## 2024-07-21 DIAGNOSIS — I129 Hypertensive chronic kidney disease with stage 1 through stage 4 chronic kidney disease, or unspecified chronic kidney disease: Secondary | ICD-10-CM | POA: Diagnosis not present

## 2024-07-21 DIAGNOSIS — N1832 Chronic kidney disease, stage 3b: Secondary | ICD-10-CM | POA: Diagnosis not present

## 2024-07-21 DIAGNOSIS — Z1231 Encounter for screening mammogram for malignant neoplasm of breast: Secondary | ICD-10-CM

## 2024-07-21 DIAGNOSIS — M1990 Unspecified osteoarthritis, unspecified site: Secondary | ICD-10-CM | POA: Diagnosis not present

## 2024-07-22 ENCOUNTER — Other Ambulatory Visit: Payer: Self-pay | Admitting: Family Medicine

## 2024-07-22 DIAGNOSIS — Z1231 Encounter for screening mammogram for malignant neoplasm of breast: Secondary | ICD-10-CM | POA: Diagnosis not present

## 2024-07-22 LAB — HM MAMMOGRAPHY

## 2024-07-22 MED ORDER — AMLODIPINE BESYLATE 5 MG PO TABS: 5.0000 mg | ORAL_TABLET | Freq: Every day | ORAL | 2 refills | Status: DC

## 2024-07-22 NOTE — Telephone Encounter (Signed)
 Copied from CRM [id number]. Topic: Clinical - Medication Refill >> Jul 22, 2024 10:16 AM Thersia BROCKS wrote: Medication: amLODipine  (NORVASC ) 5 MG tablet  Has the patient contacted their pharmacy? Yes (Agent: If no, request that the patient contact the pharmacy for the refill. If patient does not wish to contact the pharmacy document the reason why and proceed with request.) (Agent: If yes, when and what did the pharmacy advise?)  This is the patient's preferred pharmacy:  [facility] [id number] - [place], [place] - [address] [place] [address] Phone: [phone] Fax: [phone]  Is this the correct pharmacy for this prescription? Yes If no, delete pharmacy and type the correct one.   Has the prescription been filled recently? No  Is the patient out of the medication? Yes  Has the patient been seen for an appointment in the last year OR does the patient have an upcoming appointment? Yes  Can we respond through MyChart? Yes  Agent: Please be advised that Rx refills may take up to 3 business days. We ask that you follow-up with your pharmacy.

## 2024-07-25 ENCOUNTER — Encounter: Payer: Self-pay | Admitting: Family Medicine

## 2024-07-28 ENCOUNTER — Other Ambulatory Visit: Payer: Self-pay | Admitting: Family Medicine

## 2024-08-04 DIAGNOSIS — R922 Inconclusive mammogram: Secondary | ICD-10-CM | POA: Diagnosis not present

## 2024-08-04 LAB — HM MAMMOGRAPHY

## 2024-08-05 ENCOUNTER — Encounter: Payer: Self-pay | Admitting: Family Medicine

## 2024-08-12 ENCOUNTER — Ambulatory Visit

## 2024-09-16 DIAGNOSIS — D485 Neoplasm of uncertain behavior of skin: Secondary | ICD-10-CM | POA: Diagnosis not present

## 2024-09-16 DIAGNOSIS — Z08 Encounter for follow-up examination after completed treatment for malignant neoplasm: Secondary | ICD-10-CM | POA: Diagnosis not present

## 2024-09-16 DIAGNOSIS — L814 Other melanin hyperpigmentation: Secondary | ICD-10-CM | POA: Diagnosis not present

## 2024-09-16 DIAGNOSIS — Z85828 Personal history of other malignant neoplasm of skin: Secondary | ICD-10-CM | POA: Diagnosis not present

## 2024-09-16 DIAGNOSIS — L578 Other skin changes due to chronic exposure to nonionizing radiation: Secondary | ICD-10-CM | POA: Diagnosis not present

## 2024-09-16 DIAGNOSIS — D1801 Hemangioma of skin and subcutaneous tissue: Secondary | ICD-10-CM | POA: Diagnosis not present

## 2024-09-16 DIAGNOSIS — L821 Other seborrheic keratosis: Secondary | ICD-10-CM | POA: Diagnosis not present

## 2024-09-16 DIAGNOSIS — Z85820 Personal history of malignant melanoma of skin: Secondary | ICD-10-CM | POA: Diagnosis not present

## 2024-10-19 ENCOUNTER — Other Ambulatory Visit: Payer: Self-pay | Admitting: Family Medicine

## 2024-10-19 MED ORDER — AMLODIPINE BESYLATE 5 MG PO TABS: 5.0000 mg | ORAL_TABLET | Freq: Every day | ORAL | 2 refills | Status: AC

## 2024-10-19 NOTE — Telephone Encounter (Signed)
 Copied from CRM [id number]. Topic: Clinical - Medication Refill >> Oct 19, 2024  9:47 AM Avram MATSU wrote: Medication: amLODipine  (NORVASC ) 5 MG tablet [[id number]]  Has the patient contacted their pharmacy? Yes (Agent: If no, request that the patient contact the pharmacy for the refill. If patient does not wish to contact the pharmacy document the reason why and proceed with request.) (Agent: If yes, when and what did the pharmacy advise?)  This is the patient's preferred pharmacy:  [facility] [id number] - [place], [place] - [address] [place] [address] Phone: [phone] Fax: [phone]  Is this the correct pharmacy for this prescription? Yes If no, delete pharmacy and type the correct one.   Has the prescription been filled recently? No  Is the patient out of the medication? No  Has the patient been seen for an appointment in the last year OR does the patient have an upcoming appointment? Yes  Can we respond through MyChart? Yes  Agent: Please be advised that Rx refills may take up to 3 business days. We ask that you follow-up with your pharmacy.

## 2024-10-25 ENCOUNTER — Other Ambulatory Visit: Payer: Self-pay | Admitting: Family Medicine

## 2024-11-09 ENCOUNTER — Ambulatory Visit

## 2024-11-11 ENCOUNTER — Ambulatory Visit (INDEPENDENT_AMBULATORY_CARE_PROVIDER_SITE_OTHER)

## 2024-11-11 VITALS — BP 120/60 | HR 67 | Temp 97.7°F | Ht <= 58 in | Wt 99.0 lb

## 2024-11-11 DIAGNOSIS — Z0000 Encounter for general adult medical examination without abnormal findings: Secondary | ICD-10-CM | POA: Diagnosis not present

## 2024-11-11 NOTE — Patient Instructions (Addendum)
 Veronica Wells,  Thank you for taking the time for your Medicare Wellness Visit. I appreciate your continued commitment to your health goals. Please review the care plan we discussed, and feel free to reach out if I can assist you further.  Please note that Annual Wellness Visits do not include a physical exam. Some assessments may be limited, especially if the visit was conducted virtually. If needed, we may recommend an in-person follow-up with your provider.  Ongoing Care Seeing your primary care provider every 3 to 6 months helps us  monitor your health and provide consistent, personalized care.   Referrals If a referral was made during today's visit and you haven't received any updates within two weeks, please contact the referred provider directly to check on the status.  Recommended Screenings:  Health Maintenance  Topic Date Due   Zoster (Shingles) Vaccine (1 of 2) Never done   Flu Shot  Never done   COVID-19 Vaccine (2 - 2025-26 season) 06/13/2024   Medicare Annual Wellness Visit  11/11/2025   DTaP/Tdap/Td vaccine (3 - Td or Tdap) 06/24/2031   Pneumococcal Vaccine for age over 53  Completed   Osteoporosis screening with Bone Density Scan  Completed   Meningitis B Vaccine  Aged Out       11/11/2024    8:57 AM  Advanced Directives  Does Patient Have a Medical Advance Directive? Yes  Type of Estate Agent of [place];Living will  Does patient want to make changes to medical advance directive? No - Patient declined  Copy of Healthcare Power of Attorney in Chart? No - copy requested    Vision: Annual vision screenings are recommended for early detection of glaucoma, cataracts, and diabetic retinopathy. These exams can also reveal signs of chronic conditions such as diabetes and high blood pressure.  Dental: Annual dental screenings help detect early signs of oral cancer, gum disease, and other conditions linked to overall health, including heart  disease and diabetes.  Please see the attached documents for additional preventive care recommendations.

## 2025-11-17 ENCOUNTER — Ambulatory Visit
